# Patient Record
Sex: Female | Born: 1945
Health system: Southern US, Community
[De-identification: ages and names within clinical notes are randomized; demographics above are authoritative.]

## PROBLEM LIST (undated history)

## (undated) DIAGNOSIS — E7800 Pure hypercholesterolemia, unspecified: Secondary | ICD-10-CM

## (undated) DIAGNOSIS — R0600 Dyspnea, unspecified: Secondary | ICD-10-CM

## (undated) DIAGNOSIS — M1990 Unspecified osteoarthritis, unspecified site: Secondary | ICD-10-CM

## (undated) DIAGNOSIS — T8859XA Other complications of anesthesia, initial encounter: Secondary | ICD-10-CM

## (undated) DIAGNOSIS — J189 Pneumonia, unspecified organism: Secondary | ICD-10-CM

## (undated) DIAGNOSIS — Z87442 Personal history of urinary calculi: Secondary | ICD-10-CM

## (undated) DIAGNOSIS — E119 Type 2 diabetes mellitus without complications: Secondary | ICD-10-CM

## (undated) DIAGNOSIS — I2699 Other pulmonary embolism without acute cor pulmonale: Secondary | ICD-10-CM

## (undated) DIAGNOSIS — K2270 Barrett's esophagus without dysplasia: Secondary | ICD-10-CM

## (undated) DIAGNOSIS — I10 Essential (primary) hypertension: Secondary | ICD-10-CM

## (undated) DIAGNOSIS — Z9889 Other specified postprocedural states: Secondary | ICD-10-CM

## (undated) DIAGNOSIS — M810 Age-related osteoporosis without current pathological fracture: Secondary | ICD-10-CM

## (undated) DIAGNOSIS — R51 Headache: Secondary | ICD-10-CM

## (undated) DIAGNOSIS — G709 Myoneural disorder, unspecified: Secondary | ICD-10-CM

## (undated) DIAGNOSIS — M069 Rheumatoid arthritis, unspecified: Secondary | ICD-10-CM

## (undated) DIAGNOSIS — E039 Hypothyroidism, unspecified: Secondary | ICD-10-CM

## (undated) DIAGNOSIS — R112 Nausea with vomiting, unspecified: Secondary | ICD-10-CM

## (undated) DIAGNOSIS — K219 Gastro-esophageal reflux disease without esophagitis: Secondary | ICD-10-CM

## (undated) DIAGNOSIS — F329 Major depressive disorder, single episode, unspecified: Secondary | ICD-10-CM

## (undated) DIAGNOSIS — K5792 Diverticulitis of intestine, part unspecified, without perforation or abscess without bleeding: Secondary | ICD-10-CM

## (undated) DIAGNOSIS — F32A Depression, unspecified: Secondary | ICD-10-CM

## (undated) DIAGNOSIS — O2230 Deep phlebothrombosis in pregnancy, unspecified trimester: Secondary | ICD-10-CM

## (undated) DIAGNOSIS — T4145XA Adverse effect of unspecified anesthetic, initial encounter: Secondary | ICD-10-CM

## (undated) HISTORY — PX: EYE SURGERY: SHX253

## (undated) HISTORY — PX: REFRACTIVE SURGERY: SHX103

## (undated) HISTORY — DX: Diverticulitis of intestine, part unspecified, without perforation or abscess without bleeding: K57.92

## (undated) HISTORY — PX: BREAST SURGERY: SHX581

## (undated) HISTORY — PX: KNEE ARTHROSCOPY: SHX127

## (undated) HISTORY — PX: ESOPHAGOGASTRODUODENOSCOPY (EGD) WITH ESOPHAGEAL DILATION: SHX5812

## (undated) HISTORY — PX: APPENDECTOMY: SHX54

## (undated) HISTORY — PX: CHOLECYSTECTOMY OPEN: SUR202

## (undated) HISTORY — PX: INCONTINENCE SURGERY: SHX676

## (undated) HISTORY — PX: JOINT REPLACEMENT: SHX530

## (undated) HISTORY — PX: BACK SURGERY: SHX140

## (undated) HISTORY — PX: CYSTOSCOPY W/ STONE MANIPULATION: SHX1427

---

## 1966-10-16 HISTORY — PX: BREAST SURGERY: SHX581

## 1971-10-17 DIAGNOSIS — O2230 Deep phlebothrombosis in pregnancy, unspecified trimester: Secondary | ICD-10-CM

## 1971-10-17 HISTORY — DX: Deep phlebothrombosis in pregnancy, unspecified trimester: O22.30

## 1982-10-16 HISTORY — PX: DILATION AND CURETTAGE OF UTERUS: SHX78

## 1988-10-16 HISTORY — PX: ABDOMINAL HYSTERECTOMY: SHX81

## 1990-10-16 HISTORY — PX: ABDOMINAL ADHESION SURGERY: SHX90

## 1999-10-17 HISTORY — PX: CYST EXCISION: SHX5701

## 2005-10-16 HISTORY — PX: ENTEROCELE REPAIR: SHX623

## 2011-08-17 ENCOUNTER — Encounter (HOSPITAL_COMMUNITY): Payer: Self-pay | Admitting: Pharmacy Technician

## 2011-08-17 ENCOUNTER — Encounter (HOSPITAL_COMMUNITY)
Admission: RE | Admit: 2011-08-17 | Discharge: 2011-08-17 | Disposition: A | Discharge status: Home / Self Care | Payer: Medicare Other | Source: Ambulatory Visit | Attending: Neurosurgery | Admitting: Neurosurgery

## 2011-08-17 ENCOUNTER — Encounter (HOSPITAL_COMMUNITY): Payer: Self-pay

## 2011-08-17 DIAGNOSIS — D497 Neoplasm of unspecified behavior of endocrine glands and other parts of nervous system: Secondary | ICD-10-CM | POA: Insufficient documentation

## 2011-08-17 HISTORY — DX: Myoneural disorder, unspecified: G70.9

## 2011-08-17 HISTORY — DX: Unspecified osteoarthritis, unspecified site: M19.90

## 2011-08-17 HISTORY — DX: Headache: R51

## 2011-08-17 HISTORY — DX: Gastro-esophageal reflux disease without esophagitis: K21.9

## 2011-08-17 LAB — DIFFERENTIAL
Basophils Relative: 0 % (ref 0–1)
Eosinophils Absolute: 0.4 10*3/uL (ref 0.0–0.7)
Eosinophils Relative: 6 % — ABNORMAL HIGH (ref 0–5)
Monocytes Absolute: 0.5 10*3/uL (ref 0.1–1.0)
Monocytes Relative: 8 % (ref 3–12)
Neutrophils Relative %: 58 % (ref 43–77)

## 2011-08-17 LAB — SURGICAL PCR SCREEN
MRSA, PCR: NEGATIVE
Staphylococcus aureus: NEGATIVE

## 2011-08-17 LAB — BASIC METABOLIC PANEL
Calcium: 9.8 mg/dL (ref 8.4–10.5)
Creatinine, Ser: 0.8 mg/dL (ref 0.50–1.10)
GFR calc non Af Amer: 76 mL/min — ABNORMAL LOW (ref >90)
Sodium: 141 mEq/L (ref 135–145)

## 2011-08-17 LAB — CBC
MCH: 31.1 pg (ref 26.0–34.0)
MCHC: 34.5 g/dL (ref 30.0–36.0)
MCV: 90.4 fL (ref 78.0–100.0)
Platelets: 195 10*3/uL (ref 150–400)
RDW: 11.9 % (ref 11.5–15.5)

## 2011-08-17 NOTE — Pre-Procedure Instructions (Signed)
20 MIREYA MEDITZ  08/17/2011   Your procedure is scheduled on: NOV 19 Report to [facility] at 0900 AM.   Call this number if you have problems the morning of surgery: [phone]   Remember:   Do not eat food:After Midnight.  Do not drink clear liquids: 4 Hours before arrival.  Take these medicines the morning of surgery with A SIP OF WATER: NONE  NE  Do not wear jewelry, make-up or nail polish.  Do not wear lotions, powders, or perfumes. You may wear deodorant.  Do not shave 48 hours prior to surgery.  Do not bring valuables to the hospital.  Contacts, dentures or bridgework may not be worn into surgery.  Leave suitcase in the car. After surgery it may be brought to your room.  For patients admitted to the hospital, checkout time is 11:00 AM the day of discharge.   Patients discharged the day of surgery will not be allowed to drive home.  Name and phone number of your driver: FAMILY  Special Instructions: CHG Shower Use Special Wash: 1/2 bottle night before surgery and 1/2 bottle morning of surgery.   Please read over the following fact sheets that you were given: Pain Booklet, Coughing and Deep Breathing, Blood Transfusion Information, Lab Information and Surgical Site Infection Prevention

## 2011-08-31 NOTE — Pre-Procedure Instructions (Signed)
20 Sara Diaz  08/31/2011   Your procedure is scheduled on:  November 19  Report to [facility] at 8:45 AM.  Call this number if you have problems the morning of surgery: [phone]   Remember:   Do not eat food:After Midnight.  Do not drink clear liquids: 4 Hours before arrival.  Take these medicines the morning of surgery with A SIP OF WATER: Nadolol   Do not wear jewelry, make-up or nail polish.  Do not wear lotions, powders, or perfumes. You may wear deodorant.  Do not shave 48 hours prior to surgery.  Do not bring valuables to the hospital.  Contacts, dentures or bridgework may not be worn into surgery.  Leave suitcase in the car. After surgery it may be brought to your room.  For patients admitted to the hospital, checkout time is 11:00 AM the day of discharge.   Patients discharged the day of surgery will not be allowed to drive home.  Name and phone number of your driver: family  Special Instructions: CHG Shower Use Special Wash: 1/2 bottle night before surgery and 1/2 bottle morning of surgery.   Please read over the following fact sheets that you were given: Pain Booklet, Coughing and Deep Breathing, Blood Transfusion Information and Surgical Site Infection Prevention

## 2011-09-01 ENCOUNTER — Encounter (HOSPITAL_COMMUNITY)
Admission: RE | Admit: 2011-09-01 | Discharge: 2011-09-01 | Disposition: A | Discharge status: Home / Self Care | Payer: Medicare Other | Source: Ambulatory Visit | Attending: Neurosurgery | Admitting: Neurosurgery

## 2011-09-01 ENCOUNTER — Other Ambulatory Visit: Payer: Self-pay

## 2011-09-01 LAB — BASIC METABOLIC PANEL
BUN: 14 mg/dL (ref 6–23)
Calcium: 9.6 mg/dL (ref 8.4–10.5)
Creatinine, Ser: 0.83 mg/dL (ref 0.50–1.10)
GFR calc Af Amer: 84 mL/min — ABNORMAL LOW (ref >90)

## 2011-09-01 LAB — CBC
HCT: 41.4 % (ref 36.0–46.0)
MCHC: 34.3 g/dL (ref 30.0–36.0)
MCV: 90.4 fL (ref 78.0–100.0)
Platelets: 179 10*3/uL (ref 150–400)
RDW: 11.9 % (ref 11.5–15.5)
WBC: 5.2 10*3/uL (ref 4.0–10.5)

## 2011-09-01 LAB — DIFFERENTIAL
Basophils Absolute: 0 10*3/uL (ref 0.0–0.1)
Basophils Relative: 1 % (ref 0–1)
Eosinophils Relative: 5 % (ref 0–5)
Lymphocytes Relative: 32 % (ref 12–46)
Monocytes Absolute: 0.3 10*3/uL (ref 0.1–1.0)

## 2011-09-03 MED ORDER — DEXAMETHASONE SODIUM PHOSPHATE 10 MG/ML IJ SOLN
10.0000 mg | Freq: Once | INTRAMUSCULAR | Status: AC
  Administered 2011-09-04: 10 mg via INTRAVENOUS
  Filled 2011-09-03: qty 1

## 2011-09-04 ENCOUNTER — Encounter (HOSPITAL_COMMUNITY): Payer: Self-pay | Admitting: Anesthesiology

## 2011-09-04 ENCOUNTER — Encounter (HOSPITAL_COMMUNITY)
Admission: RE | Disposition: A | Discharge status: Home / Self Care | Payer: Self-pay | Source: Ambulatory Visit | Attending: Neurosurgery

## 2011-09-04 ENCOUNTER — Inpatient Hospital Stay (HOSPITAL_COMMUNITY)
Admission: RE | Admit: 2011-09-04 | Discharge: 2011-09-05 | DRG: 030 | Disposition: A | Discharge status: Home / Self Care | Payer: Medicare Other | Source: Ambulatory Visit | Attending: Neurosurgery | Admitting: Neurosurgery

## 2011-09-04 ENCOUNTER — Other Ambulatory Visit: Payer: Self-pay | Admitting: Neurosurgery

## 2011-09-04 ENCOUNTER — Inpatient Hospital Stay (HOSPITAL_COMMUNITY): Payer: Medicare Other | Admitting: Anesthesiology

## 2011-09-04 DIAGNOSIS — E119 Type 2 diabetes mellitus without complications: Secondary | ICD-10-CM | POA: Diagnosis present

## 2011-09-04 DIAGNOSIS — Z01810 Encounter for preprocedural cardiovascular examination: Secondary | ICD-10-CM

## 2011-09-04 DIAGNOSIS — D321 Benign neoplasm of spinal meninges: Principal | ICD-10-CM | POA: Diagnosis present

## 2011-09-04 DIAGNOSIS — D497 Neoplasm of unspecified behavior of endocrine glands and other parts of nervous system: Secondary | ICD-10-CM | POA: Diagnosis present

## 2011-09-04 DIAGNOSIS — R51 Headache: Secondary | ICD-10-CM | POA: Diagnosis present

## 2011-09-04 DIAGNOSIS — M129 Arthropathy, unspecified: Secondary | ICD-10-CM | POA: Diagnosis present

## 2011-09-04 DIAGNOSIS — K219 Gastro-esophageal reflux disease without esophagitis: Secondary | ICD-10-CM | POA: Diagnosis present

## 2011-09-04 DIAGNOSIS — Z01812 Encounter for preprocedural laboratory examination: Secondary | ICD-10-CM

## 2011-09-04 DIAGNOSIS — I739 Peripheral vascular disease, unspecified: Secondary | ICD-10-CM | POA: Diagnosis present

## 2011-09-04 DIAGNOSIS — M79609 Pain in unspecified limb: Secondary | ICD-10-CM | POA: Diagnosis present

## 2011-09-04 DIAGNOSIS — R29898 Other symptoms and signs involving the musculoskeletal system: Secondary | ICD-10-CM | POA: Diagnosis present

## 2011-09-04 HISTORY — PX: LAMINECTOMY: SHX219

## 2011-09-04 SURGERY — Surgical Case
Anesthesia: *Unknown

## 2011-09-04 SURGERY — LUMBAR LAMINECTOMY FOR TUMOR
Anesthesia: General | Site: Back | Wound class: Clean

## 2011-09-04 MED ORDER — NADOLOL 40 MG PO TABS
40.0000 mg | ORAL_TABLET | Freq: Every day | ORAL | Status: DC
  Filled 2011-09-04 (×3): qty 1

## 2011-09-04 MED ORDER — THROMBIN 20000 UNITS EX KIT
PACK | CUTANEOUS | Status: DC | PRN
  Administered 2011-09-04: 09:00:00 via TOPICAL

## 2011-09-04 MED ORDER — SODIUM CHLORIDE 0.9 % IJ SOLN
3.0000 mL | Freq: Two times a day (BID) | INTRAMUSCULAR | Status: DC
  Administered 2011-09-04 – 2011-09-05 (×2): 3 mL via INTRAVENOUS

## 2011-09-04 MED ORDER — KETOROLAC TROMETHAMINE 30 MG/ML IJ SOLN
30.0000 mg | Freq: Four times a day (QID) | INTRAMUSCULAR | Status: DC
  Administered 2011-09-04 – 2011-09-05 (×4): 30 mg via INTRAVENOUS
  Filled 2011-09-04 (×7): qty 1

## 2011-09-04 MED ORDER — THERA M PLUS PO TABS
1.0000 | ORAL_TABLET | Freq: Every day | ORAL | Status: DC
  Administered 2011-09-04: 1 via ORAL
  Filled 2011-09-04 (×2): qty 1

## 2011-09-04 MED ORDER — ACETAMINOPHEN 650 MG RE SUPP: 650.0000 mg | RECTAL | Status: DC | PRN

## 2011-09-04 MED ORDER — ONDANSETRON HCL 4 MG/2ML IJ SOLN: 4.0000 mg | Freq: Once | INTRAMUSCULAR | Status: DC | PRN

## 2011-09-04 MED ORDER — MENTHOL 3 MG MT LOZG: 1.0000 | LOZENGE | OROMUCOSAL | Status: DC | PRN

## 2011-09-04 MED ORDER — KETOROLAC TROMETHAMINE 30 MG/ML IJ SOLN
INTRAMUSCULAR | Status: DC | PRN
  Administered 2011-09-04: 30 mg via INTRAVENOUS

## 2011-09-04 MED ORDER — OXYCODONE-ACETAMINOPHEN 5-325 MG PO TABS
1.0000 | ORAL_TABLET | ORAL | Status: DC | PRN
  Administered 2011-09-04: 1 via ORAL
  Filled 2011-09-04: qty 1

## 2011-09-04 MED ORDER — HYDROCODONE-ACETAMINOPHEN 5-325 MG PO TABS
1.0000 | ORAL_TABLET | ORAL | Status: DC | PRN
  Administered 2011-09-04: 2 via ORAL
  Filled 2011-09-04: qty 2

## 2011-09-04 MED ORDER — BUPIVACAINE HCL (PF) 0.25 % IJ SOLN
INTRAMUSCULAR | Status: DC | PRN
  Administered 2011-09-04: 20 mL

## 2011-09-04 MED ORDER — HYDROMORPHONE HCL PF 1 MG/ML IJ SOLN: 0.2500 mg | INTRAMUSCULAR | Status: DC | PRN

## 2011-09-04 MED ORDER — LACTATED RINGERS IV SOLN
INTRAVENOUS | Status: DC | PRN
  Administered 2011-09-04 (×2): via INTRAVENOUS

## 2011-09-04 MED ORDER — PHENOL 1.4 % MT LIQD: 1.0000 | OROMUCOSAL | Status: DC | PRN

## 2011-09-04 MED ORDER — ALUM & MAG HYDROXIDE-SIMETH 400-400-40 MG/5ML PO SUSP
30.0000 mL | Freq: Four times a day (QID) | ORAL | Status: DC | PRN
  Administered 2011-09-04: 30 mL via ORAL
  Filled 2011-09-04: qty 30

## 2011-09-04 MED ORDER — LACTATED RINGERS IV SOLN: INTRAVENOUS | Status: DC

## 2011-09-04 MED ORDER — LEVOTHYROXINE SODIUM 125 MCG PO TABS
125.0000 ug | ORAL_TABLET | Freq: Every day | ORAL | Status: DC
  Administered 2011-09-04: 125 ug via ORAL
  Filled 2011-09-04 (×2): qty 1

## 2011-09-04 MED ORDER — CHOLECALCIFEROL 100 MCG (4000 UT) PO CAPS
4000.0000 [IU] | ORAL_CAPSULE | Freq: Every day | ORAL | Status: DC
  Administered 2011-09-04 – 2011-09-05 (×2): 4000 [IU] via ORAL
  Filled 2011-09-04 (×2): qty 1

## 2011-09-04 MED ORDER — ESTRADIOL 1 MG PO TABS
0.5000 mg | ORAL_TABLET | Freq: Every day | ORAL | Status: DC
  Administered 2011-09-04 – 2011-09-05 (×2): via ORAL
  Filled 2011-09-04 (×2): qty 0.5

## 2011-09-04 MED ORDER — NEOSTIGMINE METHYLSULFATE 1 MG/ML IJ SOLN
INTRAMUSCULAR | Status: DC | PRN
  Administered 2011-09-04: 5 mg via INTRAMUSCULAR

## 2011-09-04 MED ORDER — DROPERIDOL 2.5 MG/ML IJ SOLN
INTRAMUSCULAR | Status: DC | PRN
  Administered 2011-09-04: 0.625 mg via INTRAVENOUS

## 2011-09-04 MED ORDER — SENNA 8.6 MG PO TABS
1.0000 | ORAL_TABLET | Freq: Two times a day (BID) | ORAL | Status: DC
  Filled 2011-09-04 (×3): qty 1

## 2011-09-04 MED ORDER — ONDANSETRON HCL 4 MG/2ML IJ SOLN: 4.0000 mg | INTRAMUSCULAR | Status: DC | PRN

## 2011-09-04 MED ORDER — DOCUSATE SODIUM 100 MG PO CAPS
100.0000 mg | ORAL_CAPSULE | Freq: Two times a day (BID) | ORAL | Status: DC
  Administered 2011-09-05: 100 mg via ORAL
  Filled 2011-09-04 (×2): qty 1

## 2011-09-04 MED ORDER — SODIUM CHLORIDE 0.9 % IR SOLN
Status: DC | PRN
  Administered 2011-09-04: 09:00:00

## 2011-09-04 MED ORDER — MELOXICAM 15 MG PO TABS
15.0000 mg | ORAL_TABLET | Freq: Every day | ORAL | Status: DC
  Administered 2011-09-04 – 2011-09-05 (×2): 15 mg via ORAL
  Filled 2011-09-04 (×2): qty 1

## 2011-09-04 MED ORDER — SUFENTANIL CITRATE 50 MCG/ML IV SOLN
INTRAVENOUS | Status: DC | PRN
  Administered 2011-09-04: 5 ug via INTRAVENOUS
  Administered 2011-09-04: 25 ug via INTRAVENOUS

## 2011-09-04 MED ORDER — VANCOMYCIN HCL IN DEXTROSE 1-5 GM/200ML-% IV SOLN
1000.0000 mg | Freq: Once | INTRAVENOUS | Status: AC
  Administered 2011-09-04: 1000 mg via INTRAVENOUS
  Filled 2011-09-04: qty 200

## 2011-09-04 MED ORDER — SIMVASTATIN 10 MG PO TABS
10.0000 mg | ORAL_TABLET | Freq: Every day | ORAL | Status: DC
  Administered 2011-09-04: 10 mg via ORAL
  Filled 2011-09-04 (×2): qty 1

## 2011-09-04 MED ORDER — ROCURONIUM BROMIDE 100 MG/10ML IV SOLN
INTRAVENOUS | Status: DC | PRN
  Administered 2011-09-04: 55 mg via INTRAVENOUS

## 2011-09-04 MED ORDER — VANCOMYCIN HCL 500 MG IV SOLR
500.0000 mg | INTRAVENOUS | Status: AC
  Administered 2011-09-04: 500 mg via INTRAVENOUS
  Filled 2011-09-04: qty 500

## 2011-09-04 MED ORDER — SODIUM CHLORIDE 0.9 % IR SOLN
Status: DC | PRN
  Administered 2011-09-04: 1000 mL

## 2011-09-04 MED ORDER — PHENYLEPHRINE HCL 10 MG/ML IJ SOLN
10.0000 mg | INTRAVENOUS | Status: DC | PRN
  Administered 2011-09-04: 10 ug/min via INTRAVENOUS

## 2011-09-04 MED ORDER — ACETAMINOPHEN 325 MG PO TABS: 650.0000 mg | ORAL_TABLET | ORAL | Status: DC | PRN

## 2011-09-04 MED ORDER — AMITRIPTYLINE HCL 50 MG PO TABS
50.0000 mg | ORAL_TABLET | Freq: Every day | ORAL | Status: DC
  Administered 2011-09-04: 50 mg via ORAL
  Filled 2011-09-04 (×2): qty 1

## 2011-09-04 MED ORDER — GLYCOPYRROLATE 0.2 MG/ML IJ SOLN
INTRAMUSCULAR | Status: DC | PRN
  Administered 2011-09-04: .6 mg via INTRAVENOUS

## 2011-09-04 MED ORDER — DEXAMETHASONE SODIUM PHOSPHATE 4 MG/ML IJ SOLN
4.0000 mg | Freq: Four times a day (QID) | INTRAMUSCULAR | Status: DC
  Administered 2011-09-04 (×2): 4 mg via INTRAVENOUS
  Filled 2011-09-04 (×7): qty 1

## 2011-09-04 MED ORDER — HYDROMORPHONE HCL PF 1 MG/ML IJ SOLN
0.5000 mg | INTRAMUSCULAR | Status: DC | PRN
  Administered 2011-09-04: 0.5 mg via INTRAVENOUS
  Filled 2011-09-04: qty 1

## 2011-09-04 MED ORDER — PROPOFOL 10 MG/ML IV EMUL
INTRAVENOUS | Status: DC | PRN
  Administered 2011-09-04: 200 mg via INTRAVENOUS

## 2011-09-04 MED ORDER — ZOLPIDEM TARTRATE 5 MG PO TABS: 5.0000 mg | ORAL_TABLET | Freq: Every evening | ORAL | Status: DC | PRN

## 2011-09-04 MED ORDER — ONDANSETRON HCL 4 MG/2ML IJ SOLN
INTRAMUSCULAR | Status: DC | PRN
  Administered 2011-09-04: 4 mg via INTRAVENOUS

## 2011-09-04 MED ORDER — DEXAMETHASONE 4 MG PO TABS
4.0000 mg | ORAL_TABLET | Freq: Four times a day (QID) | ORAL | Status: DC
  Administered 2011-09-05 (×2): 4 mg via ORAL
  Filled 2011-09-04 (×3): qty 1
  Filled 2011-09-04: qty 21
  Filled 2011-09-04 (×2): qty 1

## 2011-09-04 SURGICAL SUPPLY — 68 items
BAG DECANTER FOR FLEXI CONT (MISCELLANEOUS) ×2 IMPLANT
BENZOIN TINCTURE PRP APPL 2/3 (GAUZE/BANDAGES/DRESSINGS) ×2 IMPLANT
BLADE SURG 11 STRL SS (BLADE) IMPLANT
BLADE SURG ROTATE 9660 (MISCELLANEOUS) IMPLANT
BRUSH SCRUB EZ PLAIN DRY (MISCELLANEOUS) ×2 IMPLANT
BUR CUTTER 7.0 ROUND (BURR) ×2 IMPLANT
BUR MATCHSTICK NEURO 3.0 LAGG (BURR) ×2 IMPLANT
CANISTER SUCTION 2500CC (MISCELLANEOUS) ×2 IMPLANT
CLOSURE STERI STRIP 1/2 X4 (GAUZE/BANDAGES/DRESSINGS) ×2 IMPLANT
CLOTH BEACON ORANGE TIMEOUT ST (SAFETY) ×2 IMPLANT
CONT SPEC 4OZ CLIKSEAL STRL BL (MISCELLANEOUS) ×4 IMPLANT
DERMABOND ADVANCED (GAUZE/BANDAGES/DRESSINGS) ×1
DERMABOND ADVANCED .7 DNX12 (GAUZE/BANDAGES/DRESSINGS) ×1 IMPLANT
DRAPE LAPAROTOMY 100X72X124 (DRAPES) ×2 IMPLANT
DRAPE LAPAROTOMY T 102X78X121 (DRAPES) IMPLANT
DRAPE MICROSCOPE LEICA (MISCELLANEOUS) ×2 IMPLANT
DRAPE POUCH INSTRU U-SHP 10X18 (DRAPES) ×2 IMPLANT
DRAPE SURG 17X23 STRL (DRAPES) ×4 IMPLANT
DURASEAL SPINE SEALANT ×2 IMPLANT
ELECT REM PT RETURN 9FT ADLT (ELECTROSURGICAL) ×2
ELECTRODE REM PT RTRN 9FT ADLT (ELECTROSURGICAL) ×1 IMPLANT
GAUZE SPONGE 4X4 16PLY XRAY LF (GAUZE/BANDAGES/DRESSINGS) ×2 IMPLANT
GLOVE BIO SURGEON STRL SZ8 (GLOVE) ×2 IMPLANT
GLOVE BIOGEL PI IND STRL 7.0 (GLOVE) ×1 IMPLANT
GLOVE BIOGEL PI INDICATOR 7.0 (GLOVE) ×1
GLOVE ECLIPSE 8.5 STRL (GLOVE) ×2 IMPLANT
GLOVE EXAM NITRILE LRG STRL (GLOVE) ×4 IMPLANT
GLOVE EXAM NITRILE MD LF STRL (GLOVE) ×2 IMPLANT
GLOVE EXAM NITRILE XL STR (GLOVE) IMPLANT
GLOVE EXAM NITRILE XS STR PU (GLOVE) IMPLANT
GLOVE SURG SS PI 6.5 STRL IVOR (GLOVE) ×4 IMPLANT
GOWN BRE IMP SLV AUR LG STRL (GOWN DISPOSABLE) IMPLANT
GOWN BRE IMP SLV AUR XL STRL (GOWN DISPOSABLE) ×6 IMPLANT
GOWN STRL REIN 2XL LVL4 (GOWN DISPOSABLE) IMPLANT
HEMOSTAT SURGICEL 2X14 (HEMOSTASIS) IMPLANT
KIT BASIN OR (CUSTOM PROCEDURE TRAY) ×2 IMPLANT
KIT ROOM TURNOVER OR (KITS) ×2 IMPLANT
NEEDLE HYPO 22GX1.5 SAFETY (NEEDLE) IMPLANT
NEEDLE HYPO 25X1 1.5 SAFETY (NEEDLE) ×2 IMPLANT
NEEDLE SPNL 20GX3.5 QUINCKE YW (NEEDLE) ×2 IMPLANT
NS IRRIG 1000ML POUR BTL (IV SOLUTION) ×2 IMPLANT
PACK LAMINECTOMY NEURO (CUSTOM PROCEDURE TRAY) ×2 IMPLANT
PAD EYE OVAL STERILE LF (GAUZE/BANDAGES/DRESSINGS) IMPLANT
PATTIES SURGICAL .5 X.5 (GAUZE/BANDAGES/DRESSINGS) IMPLANT
PATTIES SURGICAL .5 X3 (DISPOSABLE) IMPLANT
RUBBERBAND STERILE (MISCELLANEOUS) ×4 IMPLANT
SPONGE GAUZE 4X4 12PLY (GAUZE/BANDAGES/DRESSINGS) ×2 IMPLANT
SPONGE LAP 4X18 X RAY DECT (DISPOSABLE) IMPLANT
SPONGE SURGIFOAM ABS GEL 100 (HEMOSTASIS) ×2 IMPLANT
STAPLER VISISTAT 35W (STAPLE) ×2 IMPLANT
STRIP CLOSURE SKIN 1/2X4 (GAUZE/BANDAGES/DRESSINGS) ×2 IMPLANT
SUT BONE WAX W31G (SUTURE) IMPLANT
SUT ETHILON 4 0 PS 2 18 (SUTURE) IMPLANT
SUT NURALON 4 0 TR CR/8 (SUTURE) ×4 IMPLANT
SUT PROLENE 5 0 C1 (SUTURE) ×2 IMPLANT
SUT PROLENE 6 0 BV (SUTURE) IMPLANT
SUT VIC AB 0 CT1 18XCR BRD8 (SUTURE) ×2 IMPLANT
SUT VIC AB 0 CT1 8-18 (SUTURE) ×2
SUT VIC AB 2-0 CT1 18 (SUTURE) ×2 IMPLANT
SUT VIC AB 3-0 SH 8-18 (SUTURE) ×4 IMPLANT
SUT VICRYL 4-0 PS2 18IN ABS (SUTURE) ×2 IMPLANT
SYR 20ML ECCENTRIC (SYRINGE) IMPLANT
TAPE CLOTH SURG 4X10 WHT LF (GAUZE/BANDAGES/DRESSINGS) ×2 IMPLANT
TOWEL OR 17X24 6PK STRL BLUE (TOWEL DISPOSABLE) ×2 IMPLANT
TOWEL OR 17X26 10 PK STRL BLUE (TOWEL DISPOSABLE) ×2 IMPLANT
TRAY FOLEY CATH 14FRSI W/METER (CATHETERS) ×2 IMPLANT
TUBE CONNECTING 12X1/4 (SUCTIONS) IMPLANT
WATER STERILE IRR 1000ML POUR (IV SOLUTION) ×2 IMPLANT

## 2011-09-04 NOTE — Anesthesia Postprocedure Evaluation (Signed)
  Anesthesia Post-op Note  Patient: Sara Diaz  Procedure(s) Performed:  LUMBAR LAMINECTOMY FOR TUMOR - Lumbar five laminectomy for resection of intradural tumor  Patient Location: PACU  Anesthesia Type: General  Level of Consciousness: awake, alert , oriented and patient cooperative  Airway and Oxygen Therapy: Patient Spontanous Breathing and Patient connected to nasal cannula oxygen  Post-op Pain: mild  Post-op Assessment: Post-op Vital signs reviewed, Patient's Cardiovascular Status Stable, Respiratory Function Stable, Patent Airway and No signs of Nausea or vomiting  Post-op Vital Signs: stable  Complications: No apparent anesthesia complications

## 2011-09-04 NOTE — Progress Notes (Addendum)
Pharmacy note: vancomycin 12hrs post-op x1 -65 yo female s/p laminectomy  -SCr=0.83 (11/16)  -wt= 78kg  -vancomycin 500mg  IV given at 0725 this am -Will give 1000mg  IV vancomycin tonight at 7pm then no additional doses needed.  Harland German, Pharm D 09/04/2011 12:47 PM

## 2011-09-04 NOTE — Brief Op Note (Signed)
09/04/2011  9:55 AM  PATIENT:  Stephan Minister  65 y.o. 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   Delay start of Pharmacological VTE agent (>24hrs) due to surgical blood loss or risk of bleeding:  yes

## 2011-09-04 NOTE — Transfer of Care (Signed)
Immediate Anesthesia Transfer of Care Note  Patient: Sara Diaz  Procedure(s) Performed:  LUMBAR LAMINECTOMY FOR TUMOR - Lumbar five laminectomy for resection of intradural tumor  Patient Location: PACU  Anesthesia Type: General  Level of Consciousness: awake, alert , oriented and patient cooperative  Airway & Oxygen Therapy: Patient Spontanous Breathing and Patient connected to nasal cannula oxygen  Post-op Assessment: Report given to PACU RN, Post -op Vital signs reviewed and stable, Patient moving all extremities, Patient moving all extremities X 4 and Patient able to stick tongue midline  Post vital signs: Reviewed and stable  Complications: No apparent anesthesia complications

## 2011-09-04 NOTE — H&P (Signed)
Sara Diaz is an 65 y.o. female.   Chief Complaint: Right lower extremity pain HPI: Patient is a 65 year old female with history of right lumbosacral pain with radiation into her a lower trimming. Symptoms are aggravated by prolonged standing or sitting. They've not responded anti-inflammatory medications. Workup has demonstrated evidence of an enhancing intradural neoplasm at the L5 level causing marked compression and displacement of her cauda equina. Tissue presents now for resection of her intradural mass. She has no history of neoplasm otherwise. She has no history of infection. She does have remote history of a lumbar puncture for myelogram many years ago.  Past Medical History  Diagnosis Date  . Diabetes mellitus     prediabetic.lost 20 lbs.off  metformin x 1 yr  . GERD (gastroesophageal reflux disease)     off prilosec .developed cough .thought to be allergic  to prilosec  . Headache     h/a lasts 7 days.recurs monthly  . Neuromuscular disorder     weakness r leg.  . Arthritis     neck ,hands  . Peripheral vascular disease     h/o dvt w/pe 1975  . Renal calculi     bladder tack,cysto for kidney stones    Past Surgical History  Procedure Date  . Appendectomy   . Abdominal hysterectomy   . Breast surgery   . Cysto x 3   . Knee arthroscopy   . Hand surgery   . Cervical fusion     No family history on file. Social History:  reports that she has never smoked. She does not have any smokeless tobacco history on file. She reports that she does not drink alcohol. Her drug history not on file.  Allergies:  Allergies  Allergen Reactions  . Contrast Media (Iodinated Diagnostic Agents) Other (See Comments)    Convulsions  . Penicillins Rash    Medications Prior to Admission  Medication Dose Route Frequency Provider Last Rate Last Dose  . dexamethasone (DECADRON) injection 10 mg  10 mg Intravenous Once Sherilyn Cooter A    10 mg at 09/04/11 0720  . vancomycin (VANCOCIN) 500 mg  in sodium chloride 0.9 % 100 mL IVPB  500 mg Intravenous To NeurOR  A        No current outpatient prescriptions on file as of 09/04/2011.    No results found for this or any previous visit (from the past 48 hour(s)). No results found.  Review of Systems  All other systems reviewed and are negative.    Blood pressure 166/97, pulse 59, temperature 98.1 F (36.7 C), temperature source Oral, resp. rate 18, height 5\' 7"  (1.702 m), weight 78.019 kg (172 lb), SpO2 97.00%. Physical Exam  Nursing note and vitals reviewed. Constitutional: She is oriented to person, place, and time. She appears well-developed and well-nourished.  HENT:  Head: Normocephalic and atraumatic.  Right Ear: External ear normal.  Left Ear: External ear normal.  Nose: Nose normal.  Mouth/Throat: Oropharynx is clear and moist.  Eyes: Conjunctivae and EOM are normal. Pupils are equal, round, and reactive to light.  Neck: Normal range of motion. Neck supple.  Cardiovascular: Normal rate, regular rhythm, normal heart sounds and intact distal pulses.   Respiratory: Effort normal and breath sounds normal.  GI: Soft. Bowel sounds are normal.  Musculoskeletal: Normal range of motion.  Neurological: She is alert and oriented to person, place, and time. She has normal reflexes. No cranial nerve deficit. She exhibits normal muscle tone. Coordination normal.  Skin: Skin is  warm and dry.    Motor examination reveals soft slight weakness and wasting right-sided gastrotomies muscle otherwise motor she is intact or sensory symmetry of some decreased sensation over light touch in her right L5 and right S1 dermatome. Deep tendon versus a normal active except her right Achilles reflex is diminished report to her left. Assessment/Plan Enhancing intradural neoplasm with cauda equina compression. Plan is for resection of this intradural mass. Risks and benefits explained patient wishes to proceed.  , A 09/04/2011,  7:47 AM

## 2011-09-04 NOTE — Addendum Note (Signed)
Addendum  created 09/04/11 1107 by Coralee Rud   Modules edited:Charges VN

## 2011-09-04 NOTE — Plan of Care (Signed)
Problem: Consults Goal: Diagnosis - Spinal Surgery Outcome: Completed/Met Date Met:  09/04/11 Lumbar Laminectomy (Complex)

## 2011-09-04 NOTE — Anesthesia Preprocedure Evaluation (Addendum)
Anesthesia Evaluation  Patient identified by MRN, date of birth, ID band Patient awake    Reviewed: Allergy & Precautions, H&P , NPO status , Patient's Chart, lab work & pertinent test results  History of Anesthesia Complications (+) AWARENESS UNDER ANESTHESIA  Airway Mallampati: I TM Distance: >3 FB Neck ROM: full    Dental  (+) Teeth Intact   Pulmonary neg pulmonary ROS,    Pulmonary exam normal       Cardiovascular neg cardio ROS regular Normal    Neuro/Psych  Headaches,  Neuromuscular disease Negative Psych ROS   GI/Hepatic GERD-  ,  Endo/Other  Diabetes mellitus-  Renal/GU negative Renal ROS  Genitourinary negative   Musculoskeletal   Abdominal   Peds  Hematology negative hematology ROS (+)   Anesthesia Other Findings   Reproductive/Obstetrics                          Anesthesia Physical Anesthesia Plan  ASA: I  Anesthesia Plan: General   Post-op Pain Management:    Induction: Intravenous  Airway Management Planned: Oral ETT  Additional Equipment:   Intra-op Plan:   Post-operative Plan:   Informed Consent: I have reviewed the patients History and Physical, chart, labs and discussed the procedure including the risks, benefits and alternatives for the proposed anesthesia with the patient or authorized representative who has indicated his/her understanding and acceptance.     Plan Discussed with: Anesthesiologist, CRNA and Surgeon  Anesthesia Plan Comments:         Anesthesia Quick Evaluation

## 2011-09-04 NOTE — Op Note (Signed)
Date of procedure: 09/04/2011  Date of dictation: Same  Service: Neurosurgery  Preoperative diagnosis: L5 intradural neoplasm, extra medullary  Postoperative diagnosis: Same  Procedure Name: L5 laminectomy with resection of intradural tumor. Microdissection.  Surgeon: A., M.D.  Asst. Surgeon: Marikay Alar  Anesthesia: General  Indication: [name] low 65 year old woman with history of back pain with some relation to right lower extremity ongoing for many years but becoming somewhat worse. Workup has demonstrated evidence of an enhancing cystic mass within her spinal canal within the intradural space causing marked compression of her lower cauda equina. Imaging Characteristics or not pathognomonic for any particular type of tumor at this point although the possibility this may represent a epidermoid following a previous lumbar puncture versus a cystic schwannoma is the more likely scenario. Plan is for operative resection and pathologic analysis.  Operative note: Patient is taken to the operating room placed off of in a supine position. After adequate level of anesthesia was achieved, the patient positioned prone and appropriate padded. Patient's lumbar region was prepped and draped sterilely. 10 blade used to make a linear skin incision overlying the L4-5 and S1 levels. This carried down sharply in the midline. Subperiosteal dissection was then performed bilaterally exposing the lamina of L4-L5 and S1. Deep self retaining retractor was placed intraoperative x-ray was not felt to be needed. Laminectomy of L5 and inferior laminectomy of L4 and superior laminectomy of S1 were performed using Leksell rongeurs Kerrison years and a Proofreader. Minimal amounts of facetectomy were performed. Bony edges were waxed. Dura was then elevated and incised with a 15 blade. CSF was allowed to drain. The durotomy was then extended cranially and caudally. The dural edges were held in place using dural  traction sutures. Microscope was brought field these were microdissection. The tumor was readily apparent. This was densely adherent to the surrounding nerve roots particularly on the right side. These were carefully dissected free using a mixture of gentle blunt and sharp dissection. All nerve roots were dissected free from the cystic mass. The mass was resected and sent to pathologist for permanent analysis. There were no areas of intradural bleeding. Wound was then irrigated with and bike solution. Dura was reapproximated in a ring fashion using a 5-0 Prolene suture. DuraSeal fibrin sealant was placed over the dural repair. Gelfoam was placed over the laminectomy defect. Wound is inspected for hemostasis which Ascent be good. Wound is then closed in layers with Vicryl sutures. Steri-Strips and a sterile dressing were applied. There were no apparent complications. Patient tolerated the procedure well and returned to the recovery room postop.

## 2011-09-05 ENCOUNTER — Encounter (HOSPITAL_COMMUNITY): Payer: Self-pay | Admitting: Neurosurgery

## 2011-09-05 MED ORDER — HYDROCODONE-ACETAMINOPHEN 5-325 MG PO TABS: 1.0000 | ORAL_TABLET | ORAL | Status: AC | PRN

## 2011-09-05 MED FILL — Sodium Chloride Irrigation Soln 0.9%: Qty: 500 | Status: AC

## 2011-09-05 NOTE — Discharge Summary (Signed)
Physician Discharge Summary  Patient ID: TECORA EUSTACHE MRN: [id number] DOB/AGE: 1946/04/18 65 y.o.  Admit date: 09/04/2011 Discharge date: 09/05/2011  Admission Diagnoses:  Discharge Diagnoses:  Active Problems:  * No active hospital problems. *    Discharged Condition: good  Hospital Course: Patient is taken to the operating room where uncomplicated laminectomy and resection of intradural tumor is performed. Pathology is pending. Gross total tumor resection was achieved. Patient is awakened neurologically intact with minimal pain. Wound is healing well. There is no evidence of CSF leak. She is voiding without difficulty. Ready for discharge home.  Consults: none  Significant Diagnostic Studies: None  Treatments: surgery: Lumbar laminectomy with resection of intradural extramedullary tumor.  Discharge Exam: Blood pressure 135/73, pulse 76, temperature 97.7 F (36.5 C), temperature source Oral, resp. rate 16, height 5\' 7"  (1.702 m), weight 78.019 kg (172 lb), SpO2 93.00%. She is awake and alert oriented and appropriate. Cranial nerve function is intact. Motor and sensory function extremities is normal. Wound is healing well. Chest and abdomen are benign.  Disposition: Final discharge disposition not confirmed   Current Discharge Medication List    START taking these medications   Details  HYDROcodone-acetaminophen (NORCO) 5-325 MG per tablet Take 1-2 tablets by mouth every 4 (four) hours as needed for pain. Qty: 60 tablet, Refills: 1      CONTINUE these medications which have NOT CHANGED   Details  amitriptyline (ELAVIL) 50 MG tablet Take 50 mg by mouth at bedtime.      Cholecalciferol (HM VITAMIN D3) 4000 UNITS CAPS Take 4,000 Units by mouth daily.      estradiol (ESTRACE) 0.5 MG tablet Take 0.5 mg by mouth daily.      meloxicam (MOBIC) 15 MG tablet Take 15 mg by mouth daily.      nadolol (CORGARD) 40 MG tablet Take 40 mg by mouth daily.      therapeutic  multivitamin-minerals (THERAGRAN-M) tablet Take 1 tablet by mouth daily.      levothyroxine (SYNTHROID, LEVOTHROID) 125 MCG tablet Take 125 mcg by mouth daily.      pravastatin (PRAVACHOL) 20 MG tablet Take 20 mg by mouth daily.         Follow-up Information    Call , A.   Contact information:   [address] [place] [address] [phone]          Signed: Temple Pacini 09/05/2011, 10:22 AM

## 2014-02-19 ENCOUNTER — Other Ambulatory Visit: Payer: Self-pay | Admitting: Radiology

## 2014-08-08 ENCOUNTER — Emergency Department (HOSPITAL_BASED_OUTPATIENT_CLINIC_OR_DEPARTMENT_OTHER)
Admission: EM | Admit: 2014-08-08 | Discharge: 2014-08-08 | Disposition: A | Discharge status: Home / Self Care | Payer: Medicare Other | Attending: Emergency Medicine | Admitting: Emergency Medicine

## 2014-08-08 ENCOUNTER — Encounter (HOSPITAL_BASED_OUTPATIENT_CLINIC_OR_DEPARTMENT_OTHER): Payer: Self-pay | Admitting: Emergency Medicine

## 2014-08-08 DIAGNOSIS — Z87442 Personal history of urinary calculi: Secondary | ICD-10-CM | POA: Diagnosis not present

## 2014-08-08 DIAGNOSIS — Z79899 Other long term (current) drug therapy: Secondary | ICD-10-CM | POA: Insufficient documentation

## 2014-08-08 DIAGNOSIS — K219 Gastro-esophageal reflux disease without esophagitis: Secondary | ICD-10-CM | POA: Insufficient documentation

## 2014-08-08 DIAGNOSIS — L089 Local infection of the skin and subcutaneous tissue, unspecified: Secondary | ICD-10-CM | POA: Diagnosis present

## 2014-08-08 DIAGNOSIS — Z791 Long term (current) use of non-steroidal anti-inflammatories (NSAID): Secondary | ICD-10-CM | POA: Insufficient documentation

## 2014-08-08 DIAGNOSIS — Z792 Long term (current) use of antibiotics: Secondary | ICD-10-CM | POA: Diagnosis not present

## 2014-08-08 DIAGNOSIS — E119 Type 2 diabetes mellitus without complications: Secondary | ICD-10-CM | POA: Insufficient documentation

## 2014-08-08 DIAGNOSIS — Z8739 Personal history of other diseases of the musculoskeletal system and connective tissue: Secondary | ICD-10-CM | POA: Diagnosis not present

## 2014-08-08 DIAGNOSIS — Z880 Allergy status to penicillin: Secondary | ICD-10-CM | POA: Diagnosis not present

## 2014-08-08 DIAGNOSIS — T798XXA Other early complications of trauma, initial encounter: Secondary | ICD-10-CM

## 2014-08-08 MED ORDER — CEPHALEXIN 250 MG PO CAPS
500.0000 mg | ORAL_CAPSULE | Freq: Once | ORAL | Status: AC
  Administered 2014-08-08: 500 mg via ORAL
  Filled 2014-08-08: qty 2

## 2014-08-08 MED ORDER — CEPHALEXIN 500 MG PO CAPS: 500.0000 mg | ORAL_CAPSULE | Freq: Three times a day (TID) | ORAL | Status: AC

## 2014-08-08 NOTE — Discharge Instructions (Signed)
Wound Infection °A wound infection happens when a type of germ (bacteria) starts growing in the wound. In some cases, this can cause the wound to break open. If cared for properly, the infected wound will heal from the inside to the outside. Wound infections need treatment. °CAUSES °An infection is caused by bacteria growing in the wound.  °SYMPTOMS  °· Increase in redness, swelling, or pain at the wound site. °· Increase in drainage at the wound site. °· Wound or bandage (dressing) starts to smell bad. °· Fever. °· Feeling tired or fatigued. °· Pus draining from the wound. °TREATMENT  °Your health care provider will prescribe antibiotic medicine. The wound infection should improve within 24 to 48 hours. Any redness around the wound should stop spreading and the wound should be less painful.  °HOME CARE INSTRUCTIONS  °· Only take over-the-counter or prescription medicines for pain, discomfort, or fever as directed by your health care provider. °· Take your antibiotics as directed. Finish them even if you start to feel better. °· Gently wash the area with mild soap and water 2 times a day, or as directed. Rinse off the soap. Pat the area dry with a clean towel. Do not rub the wound. This may cause bleeding. °· Follow your health care provider's instructions for how often you need to change the dressing. °· Apply ointment and a dressing to the wound as directed. °· If the dressing sticks, moisten it with soapy water and gently remove it. °· Change the bandage right away if it becomes wet, dirty, or develops a bad smell. °· Take showers. Do not take tub baths, swim, or do anything that may soak the wound until it is healed. °· Avoid exercises that make you sweat heavily. °· Use anti-itch medicine as directed by your health care provider. The wound may itch when it is healing. Do not pick or scratch at the wound. °· Follow up with your health care provider to get your wound rechecked as directed. °SEEK MEDICAL CARE  IF: °· You have an increase in swelling, pain, or redness around the wound. °· You have an increase in the amount of pus coming from the wound. °· There is a bad smell coming from the wound. °· More of the wound breaks open. °· You have a fever. °MAKE SURE YOU:  °· Understand these instructions. °· Will watch your condition. °· Will get help right away if you are not doing well or get worse. °Document Released: 07/01/2003 Document Revised: 10/07/2013 Document Reviewed: 02/05/2011 °[employer]® Patient Information ©2015 [employer]. This information is not intended to replace advice given to you by your health care provider. Make sure you discuss any questions you have with your health care provider. ° °

## 2014-08-08 NOTE — ED Provider Notes (Signed)
CSN: [id number]     Arrival date & time 08/08/14  5681 History   None    Chief Complaint  Patient presents with  . Hand Injury      HPI  Presents with area of her left hand she is concerned may be infected.Marland Kitchen Has history of recurrent skin infections in this area. Have an area of ecchymosis few weeks ago. He began to drain 4-5 days ago. She feels like it is "pus" when areas of skin are either bumped or peeled back.  No streaks up the arm. No fever chills or signs of systemic infection.  Past Medical History  Diagnosis Date  . Diabetes mellitus     prediabetic.lost 20 lbs.off  metformin x 1 yr  . GERD (gastroesophageal reflux disease)     off prilosec .developed cough .thought to be allergic  to prilosec  . Headache(784.0)     h/a lasts 7 days.recurs monthly  . Neuromuscular disorder     weakness r leg.  . Arthritis     neck ,hands  . Peripheral vascular disease     h/o dvt w/pe 1975  . Renal calculi     bladder tack,cysto for kidney stones   Past Surgical History  Procedure Laterality Date  . Appendectomy    . Abdominal hysterectomy    . Breast surgery    . Cysto x 3    . Knee arthroscopy    . Hand surgery    . Cervical fusion    . Laminectomy  09/04/2011    Procedure: LUMBAR LAMINECTOMY FOR TUMOR;  Surgeon: Cooper Render Pool;  Location: [facility];  Service: Neurosurgery;  Laterality: N/A;  Lumbar five laminectomy for resection of intradural tumor  . Back surgery     No family history on file. History  Substance Use Topics  . Smoking status: Never Smoker   . Smokeless tobacco: Not on file  . Alcohol Use: No   OB History   Grav Para Term Preterm Abortions TAB SAB Ect Mult Living                 Review of Systems  Constitutional: Negative for fever, chills, diaphoresis, appetite change and fatigue.  HENT: Negative for mouth sores, sore throat and trouble swallowing.   Eyes: Negative for visual disturbance.  Respiratory: Negative for cough, chest tightness,  shortness of breath and wheezing.   Cardiovascular: Negative for chest pain.  Gastrointestinal: Negative for nausea, vomiting, abdominal pain, diarrhea and abdominal distention.  Endocrine: Negative for polydipsia, polyphagia and polyuria.  Genitourinary: Negative for dysuria, frequency and hematuria.  Musculoskeletal: Negative for gait problem.  Skin: Negative for color change, pallor and rash.       Red area with drainage near the left hand she is concerned as infected.  Neurological: Negative for dizziness, syncope, light-headedness and headaches.  Hematological: Does not bruise/bleed easily.  Psychiatric/Behavioral: Negative for behavioral problems and confusion.      Allergies  Contrast media and Penicillins  Home Medications   Prior to Admission medications   Medication Sig Start Date End Date Taking? Authorizing Provider  Canagliflozin (INVOKANA) 100 MG TABS Take by mouth.   Yes Historical Provider, MD  cycloSPORINE (RESTASIS) 0.05 % ophthalmic emulsion 1 drop 2 (two) times daily.   Yes Historical Provider, MD  folic acid (FOLVITE) 1 MG tablet Take 1 mg by mouth daily.   Yes Historical Provider, MD  methotrexate (RHEUMATREX) 2.5 MG tablet Take 7.5 mg by mouth 3 (three) times a  week.   Yes Historical Provider, MD  amitriptyline (ELAVIL) 50 MG tablet Take 50 mg by mouth at bedtime.      Historical Provider, MD  cephALEXin (KEFLEX) 500 MG capsule Take 1 capsule (500 mg total) by mouth 3 (three) times daily. 08/08/14 08/15/14  Tanna Furry, MD  Cholecalciferol (HM VITAMIN D3) 4000 UNITS CAPS Take 4,000 Units by mouth daily.      Historical Provider, MD  estradiol (ESTRACE) 0.5 MG tablet Take 0.5 mg by mouth daily.      Historical Provider, MD  levothyroxine (SYNTHROID, LEVOTHROID) 125 MCG tablet Take 125 mcg by mouth daily.      Historical Provider, MD  meloxicam (MOBIC) 15 MG tablet Take 15 mg by mouth daily.      Historical Provider, MD  nadolol (CORGARD) 40 MG tablet Take 40 mg by  mouth daily.      Historical Provider, MD  pravastatin (PRAVACHOL) 20 MG tablet Take 20 mg by mouth daily.      Historical Provider, MD  therapeutic multivitamin-minerals (THERAGRAN-M) tablet Take 1 tablet by mouth daily.      Historical Provider, MD   BP 147/73  Pulse 77  Temp(Src) 98.2 F (36.8 C) (Oral)  Resp 20  Ht 5\' 7"  (1.702 m)  Wt 160 lb (72.576 kg)  BMI 25.05 kg/m2  SpO2 99% Physical Exam  Constitutional: She is oriented to person, place, and time. She appears well-developed and well-nourished. No distress.  HENT:  Head: Normocephalic.  Eyes: Conjunctivae are normal. Pupils are equal, round, and reactive to light. No scleral icterus.  Neck: Normal range of motion. Neck supple. No thyromegaly present.  Cardiovascular: Normal rate and regular rhythm.  Exam reveals no gallop and no friction rub.   No murmur heard. Pulmonary/Chest: Effort normal and breath sounds normal. No respiratory distress. She has no wheezes. She has no rales.  Abdominal: Soft. Bowel sounds are normal. She exhibits no distension. There is no tenderness. There is no rebound.  Musculoskeletal: Normal range of motion.  Neurological: She is alert and oriented to person, place, and time.  Skin: Skin is warm and dry. No rash noted.  Area adjacent the proximal thumb or area of radial styloid of the left hand. Ecchymotic/erythematous base. Some denuded skin. Not firm indurated. No proximal spread. Erythema surrounding.   Psychiatric: She has a normal mood and affect. Her behavior is normal.    ED Course  Procedures (including critical care time) Labs Review Labs Reviewed - No data to display  Imaging Review No results found.   EKG Interpretation None      MDM   Final diagnoses:  Wound infection, initial encounter    Does not appear overtly infected. However she is immune suppressed methotrexate, and recent prednisone use ending yesterday. Panel B. continue topical care with Bactroban, and by mouth  Keflex.    Tanna Furry, MD 08/08/14 [phone]

## 2015-02-21 ENCOUNTER — Encounter (HOSPITAL_BASED_OUTPATIENT_CLINIC_OR_DEPARTMENT_OTHER): Payer: Self-pay | Admitting: *Deleted

## 2015-02-21 ENCOUNTER — Emergency Department (HOSPITAL_BASED_OUTPATIENT_CLINIC_OR_DEPARTMENT_OTHER)
Admission: EM | Admit: 2015-02-21 | Discharge: 2015-02-21 | Disposition: A | Discharge status: Home / Self Care | Payer: PPO | Attending: Emergency Medicine | Admitting: Emergency Medicine

## 2015-02-21 ENCOUNTER — Emergency Department (HOSPITAL_BASED_OUTPATIENT_CLINIC_OR_DEPARTMENT_OTHER): Payer: PPO

## 2015-02-21 DIAGNOSIS — Z792 Long term (current) use of antibiotics: Secondary | ICD-10-CM | POA: Diagnosis not present

## 2015-02-21 DIAGNOSIS — W1839XA Other fall on same level, initial encounter: Secondary | ICD-10-CM | POA: Diagnosis not present

## 2015-02-21 DIAGNOSIS — S199XXA Unspecified injury of neck, initial encounter: Secondary | ICD-10-CM | POA: Diagnosis not present

## 2015-02-21 DIAGNOSIS — W19XXXA Unspecified fall, initial encounter: Secondary | ICD-10-CM

## 2015-02-21 DIAGNOSIS — Z8679 Personal history of other diseases of the circulatory system: Secondary | ICD-10-CM | POA: Insufficient documentation

## 2015-02-21 DIAGNOSIS — Z87442 Personal history of urinary calculi: Secondary | ICD-10-CM | POA: Diagnosis not present

## 2015-02-21 DIAGNOSIS — M1990 Unspecified osteoarthritis, unspecified site: Secondary | ICD-10-CM | POA: Diagnosis not present

## 2015-02-21 DIAGNOSIS — S8002XA Contusion of left knee, initial encounter: Secondary | ICD-10-CM | POA: Diagnosis not present

## 2015-02-21 DIAGNOSIS — Y998 Other external cause status: Secondary | ICD-10-CM | POA: Insufficient documentation

## 2015-02-21 DIAGNOSIS — M069 Rheumatoid arthritis, unspecified: Secondary | ICD-10-CM | POA: Diagnosis not present

## 2015-02-21 DIAGNOSIS — Z793 Long term (current) use of hormonal contraceptives: Secondary | ICD-10-CM | POA: Insufficient documentation

## 2015-02-21 DIAGNOSIS — Z880 Allergy status to penicillin: Secondary | ICD-10-CM | POA: Insufficient documentation

## 2015-02-21 DIAGNOSIS — Y9389 Activity, other specified: Secondary | ICD-10-CM | POA: Insufficient documentation

## 2015-02-21 DIAGNOSIS — Z8669 Personal history of other diseases of the nervous system and sense organs: Secondary | ICD-10-CM | POA: Insufficient documentation

## 2015-02-21 DIAGNOSIS — S6992XA Unspecified injury of left wrist, hand and finger(s), initial encounter: Secondary | ICD-10-CM | POA: Diagnosis present

## 2015-02-21 DIAGNOSIS — E119 Type 2 diabetes mellitus without complications: Secondary | ICD-10-CM | POA: Insufficient documentation

## 2015-02-21 DIAGNOSIS — Y9289 Other specified places as the place of occurrence of the external cause: Secondary | ICD-10-CM | POA: Diagnosis not present

## 2015-02-21 DIAGNOSIS — Z8719 Personal history of other diseases of the digestive system: Secondary | ICD-10-CM | POA: Diagnosis not present

## 2015-02-21 DIAGNOSIS — S60212A Contusion of left wrist, initial encounter: Secondary | ICD-10-CM | POA: Diagnosis not present

## 2015-02-21 DIAGNOSIS — Z79899 Other long term (current) drug therapy: Secondary | ICD-10-CM | POA: Diagnosis not present

## 2015-02-21 HISTORY — DX: Rheumatoid arthritis, unspecified: M06.9

## 2015-02-21 NOTE — ED Provider Notes (Signed)
CSN: [id number]     Arrival date & time 02/21/15  1312 History   First MD Initiated Contact with Patient 02/21/15 1327     Chief Complaint  Patient presents with  . Fall     (Consider location/radiation/quality/duration/timing/severity/associated sxs/prior Treatment) Patient is a 69 y.o. female presenting with fall. The history is provided by the patient.  Fall This is a new problem. Pertinent negatives include no shortness of breath.   patient tripped and fell while walking in her hallway. Has pain in her left knee and left wrist. She did not lose consciousness. Didn't hit her head. Slight neck pain. She's been ambulatory since it. Slight bruising to her left knee and her left wrist. No numbness or weakness. She is not on anticoagulation.  Past Medical History  Diagnosis Date  . Diabetes mellitus     prediabetic.lost 20 lbs.off  metformin x 1 yr  . GERD (gastroesophageal reflux disease)     off prilosec .developed cough .thought to be allergic  to prilosec  . Headache(784.0)     h/a lasts 7 days.recurs monthly  . Neuromuscular disorder     weakness r leg.  . Arthritis     neck ,hands  . Peripheral vascular disease     h/o dvt w/pe 1975  . Renal calculi     bladder tack,cysto for kidney stones  . RA (rheumatoid arthritis)    Past Surgical History  Procedure Laterality Date  . Appendectomy    . Abdominal hysterectomy    . Breast surgery    . Cysto x 3    . Knee arthroscopy    . Hand surgery    . Cervical fusion    . Laminectomy  09/04/2011    Procedure: LUMBAR LAMINECTOMY FOR TUMOR;  Surgeon: Cooper Render Pool;  Location: [place] [facility];  Service: Neurosurgery;  Laterality: N/A;  Lumbar five laminectomy for resection of intradural tumor  . Back surgery     No family history on file. History  Substance Use Topics  . Smoking status: Never Smoker   . Smokeless tobacco: Not on file  . Alcohol Use: No   OB History    No data available     Review of Systems  Constitutional:  Negative for fever.  Respiratory: Negative for shortness of breath.   Musculoskeletal: Positive for neck pain.       Left wrist and left knee pain.  Skin: Positive for wound.  Neurological: Negative for weakness and numbness.      Allergies  Contrast media and Penicillins  Home Medications   Prior to Admission medications   Medication Sig Start Date End Date Taking? Authorizing Provider  amitriptyline (ELAVIL) 50 MG tablet Take 50 mg by mouth at bedtime.     Yes Historical Provider, MD  Canagliflozin (INVOKANA) 100 MG TABS Take by mouth.   Yes Historical Provider, MD  Cholecalciferol (HM VITAMIN D3) 4000 UNITS CAPS Take 4,000 Units by mouth daily.     Yes Historical Provider, MD  cycloSPORINE (RESTASIS) 0.05 % ophthalmic emulsion 1 drop 2 (two) times daily.   Yes Historical Provider, MD  estradiol (ESTRACE) 0.5 MG tablet Take 0.5 mg by mouth daily.     Yes Historical Provider, MD  folic acid (FOLVITE) 1 MG tablet Take 1 mg by mouth daily.   Yes Historical Provider, MD  levothyroxine (SYNTHROID, LEVOTHROID) 125 MCG tablet Take 125 mcg by mouth daily.     Yes Historical Provider, MD  linagliptin (TRADJENTA) 5 MG TABS  tablet Take 5 mg by mouth daily.   Yes Historical Provider, MD  meloxicam (MOBIC) 15 MG tablet Take 15 mg by mouth daily.      Historical Provider, MD  methotrexate (RHEUMATREX) 2.5 MG tablet Take 7.5 mg by mouth 3 (three) times a week.   Yes Historical Provider, MD  methylPREDNISolone (MEDROL DOSEPAK) 4 MG TBPK tablet Take by mouth as needed.    Historical Provider, MD  nadolol (CORGARD) 40 MG tablet Take 40 mg by mouth daily.     Yes Historical Provider, MD  pravastatin (PRAVACHOL) 20 MG tablet Take 20 mg by mouth daily.     Yes Historical Provider, MD  therapeutic multivitamin-minerals (THERAGRAN-M) tablet Take 1 tablet by mouth daily.     Yes Historical Provider, MD   BP 118/50 mmHg  Pulse 73  Temp(Src) 98.3 F (36.8 C) (Oral)  Resp 18  Ht 5\' 7"  (1.702 m)  Wt 161  lb 8 oz (73.256 kg)  BMI 25.29 kg/m2  SpO2 96% Physical Exam  Constitutional: She appears well-developed.  Cardiovascular: Normal rate.   Pulmonary/Chest: Effort normal.  Abdominal: There is no tenderness.  Musculoskeletal: She exhibits tenderness.  Mild tenderness over ulnar styloid on left wrist. No snuffbox tenderness. Range of motion intact. Slight ecchymosis to anterior left knee over the patella. No deformity. Range of motion intact. No tenderness over elbow hip or ankle.  Neurological: She is alert.    ED Course  Procedures (including critical care time) Labs Review Labs Reviewed - No data to display  Imaging Review Dg Wrist Complete Left  02/21/2015   CLINICAL DATA:  Fall. LEFT wrist pain. Medial wrist pain and swelling.  EXAM: LEFT WRIST - COMPLETE 3+ VIEW  COMPARISON:  None.  FINDINGS: Osteopenia. No displaced fracture. Scaphoid bone appears intact. Distal radius and ulna appear normal. Mild STT joint and basal joint of the thumb osteoarthritis.  IMPRESSION: No acute osseous abnormality.   Electronically Signed   By: Dereck Ligas M.D.   On: 02/21/2015 13:44     EKG Interpretation None      MDM   Final diagnoses:  Fall, initial encounter  Wrist contusion, left, initial encounter  Knee contusion, left, initial encounter    Patient with fall. Slight ecchymosis to left knee and some ecchymosis and tenderness to left wrist dorsally. Negative x-ray. Will discharge home.    Davonna Belling, MD 02/21/15 [phone]

## 2015-02-21 NOTE — Discharge Instructions (Signed)
Contusion °A contusion is a deep bruise. Contusions are the result of an injury that caused bleeding under the skin. The contusion may turn blue, purple, or yellow. Minor injuries will give you a painless contusion, but more severe contusions may stay painful and swollen for a few weeks.  °CAUSES  °A contusion is usually caused by a blow, trauma, or direct force to an area of the body. °SYMPTOMS  °· Swelling and redness of the injured area. °· Bruising of the injured area. °· Tenderness and soreness of the injured area. °· Pain. °DIAGNOSIS  °The diagnosis can be made by taking a history and physical exam. An X-ray, CT scan, or MRI may be needed to determine if there were any associated injuries, such as fractures. °TREATMENT  °Specific treatment will depend on what area of the body was injured. In general, the best treatment for a contusion is resting, icing, elevating, and applying cold compresses to the injured area. Over-the-counter medicines may also be recommended for pain control. Ask your caregiver what the best treatment is for your contusion. °HOME CARE INSTRUCTIONS  °· Put ice on the injured area. °¨ Put ice in a plastic bag. °¨ Place a towel between your skin and the bag. °¨ Leave the ice on for 15-20 minutes, 3-4 times a day, or as directed by your health care provider. °· Only take over-the-counter or prescription medicines for pain, discomfort, or fever as directed by your caregiver. Your caregiver may recommend avoiding anti-inflammatory medicines (aspirin, ibuprofen, and naproxen) for 48 hours because these medicines may increase bruising. °· Rest the injured area. °· If possible, elevate the injured area to reduce swelling. °SEEK IMMEDIATE MEDICAL CARE IF:  °· You have increased bruising or swelling. °· You have pain that is getting worse. °· Your swelling or pain is not relieved with medicines. °MAKE SURE YOU:  °· Understand these instructions. °· Will watch your condition. °· Will get help right  away if you are not doing well or get worse. °Document Released: 07/12/2005 Document Revised: 10/07/2013 Document Reviewed: 08/07/2011 °Exit[employer]® Patient Information ©2015 [employer]. This information is not intended to replace advice given to you by your health care provider. Make sure you discuss any questions you have with your health care provider. ° °

## 2015-02-21 NOTE — ED Notes (Signed)
Reports she fell in her hallway- c/o left wrist and left knee pain- denies LOC

## 2015-10-17 HISTORY — PX: TENDON REPAIR: SHX5111

## 2015-10-17 HISTORY — PX: JOINT REPLACEMENT: SHX530

## 2015-10-20 DIAGNOSIS — J029 Acute pharyngitis, unspecified: Secondary | ICD-10-CM | POA: Diagnosis not present

## 2015-10-20 DIAGNOSIS — Z6841 Body Mass Index (BMI) 40.0 and over, adult: Secondary | ICD-10-CM | POA: Diagnosis not present

## 2015-10-27 DIAGNOSIS — Z79899 Other long term (current) drug therapy: Secondary | ICD-10-CM | POA: Diagnosis not present

## 2015-10-27 DIAGNOSIS — M0579 Rheumatoid arthritis with rheumatoid factor of multiple sites without organ or systems involvement: Secondary | ICD-10-CM | POA: Diagnosis not present

## 2015-11-11 DIAGNOSIS — Z01818 Encounter for other preprocedural examination: Secondary | ICD-10-CM | POA: Diagnosis not present

## 2015-11-11 DIAGNOSIS — I493 Ventricular premature depolarization: Secondary | ICD-10-CM | POA: Diagnosis not present

## 2015-11-11 DIAGNOSIS — E1121 Type 2 diabetes mellitus with diabetic nephropathy: Secondary | ICD-10-CM | POA: Diagnosis not present

## 2015-11-22 DIAGNOSIS — Z6826 Body mass index (BMI) 26.0-26.9, adult: Secondary | ICD-10-CM | POA: Diagnosis not present

## 2015-11-22 DIAGNOSIS — Z01818 Encounter for other preprocedural examination: Secondary | ICD-10-CM | POA: Diagnosis not present

## 2015-11-22 DIAGNOSIS — M1812 Unilateral primary osteoarthritis of first carpometacarpal joint, left hand: Secondary | ICD-10-CM | POA: Diagnosis not present

## 2015-11-30 DIAGNOSIS — M19042 Primary osteoarthritis, left hand: Secondary | ICD-10-CM | POA: Diagnosis not present

## 2015-11-30 DIAGNOSIS — M189 Osteoarthritis of first carpometacarpal joint, unspecified: Secondary | ICD-10-CM | POA: Diagnosis not present

## 2015-11-30 DIAGNOSIS — M1812 Unilateral primary osteoarthritis of first carpometacarpal joint, left hand: Secondary | ICD-10-CM | POA: Diagnosis not present

## 2015-12-06 DIAGNOSIS — E039 Hypothyroidism, unspecified: Secondary | ICD-10-CM | POA: Diagnosis not present

## 2015-12-06 DIAGNOSIS — E1169 Type 2 diabetes mellitus with other specified complication: Secondary | ICD-10-CM | POA: Diagnosis not present

## 2015-12-06 DIAGNOSIS — E559 Vitamin D deficiency, unspecified: Secondary | ICD-10-CM | POA: Diagnosis not present

## 2015-12-06 DIAGNOSIS — E1165 Type 2 diabetes mellitus with hyperglycemia: Secondary | ICD-10-CM | POA: Diagnosis not present

## 2015-12-06 DIAGNOSIS — M810 Age-related osteoporosis without current pathological fracture: Secondary | ICD-10-CM | POA: Diagnosis not present

## 2015-12-06 DIAGNOSIS — E785 Hyperlipidemia, unspecified: Secondary | ICD-10-CM | POA: Diagnosis not present

## 2015-12-06 DIAGNOSIS — Z6826 Body mass index (BMI) 26.0-26.9, adult: Secondary | ICD-10-CM | POA: Diagnosis not present

## 2015-12-06 DIAGNOSIS — I10 Essential (primary) hypertension: Secondary | ICD-10-CM | POA: Diagnosis not present

## 2015-12-13 DIAGNOSIS — M25532 Pain in left wrist: Secondary | ICD-10-CM | POA: Diagnosis not present

## 2015-12-17 DIAGNOSIS — M1812 Unilateral primary osteoarthritis of first carpometacarpal joint, left hand: Secondary | ICD-10-CM | POA: Diagnosis not present

## 2015-12-17 DIAGNOSIS — S66812A Strain of other specified muscles, fascia and tendons at wrist and hand level, left hand, initial encounter: Secondary | ICD-10-CM | POA: Diagnosis not present

## 2015-12-17 DIAGNOSIS — Z6826 Body mass index (BMI) 26.0-26.9, adult: Secondary | ICD-10-CM | POA: Diagnosis not present

## 2015-12-17 DIAGNOSIS — M25532 Pain in left wrist: Secondary | ICD-10-CM | POA: Diagnosis not present

## 2015-12-21 DIAGNOSIS — M25532 Pain in left wrist: Secondary | ICD-10-CM | POA: Diagnosis not present

## 2015-12-21 DIAGNOSIS — G8918 Other acute postprocedural pain: Secondary | ICD-10-CM | POA: Diagnosis not present

## 2015-12-21 DIAGNOSIS — M66332 Spontaneous rupture of flexor tendons, left forearm: Secondary | ICD-10-CM | POA: Diagnosis not present

## 2015-12-21 DIAGNOSIS — S66812A Strain of other specified muscles, fascia and tendons at wrist and hand level, left hand, initial encounter: Secondary | ICD-10-CM | POA: Diagnosis not present

## 2015-12-29 DIAGNOSIS — S66912A Strain of unspecified muscle, fascia and tendon at wrist and hand level, left hand, initial encounter: Secondary | ICD-10-CM | POA: Diagnosis not present

## 2016-01-11 DIAGNOSIS — M1712 Unilateral primary osteoarthritis, left knee: Secondary | ICD-10-CM | POA: Diagnosis not present

## 2016-01-19 DIAGNOSIS — M1812 Unilateral primary osteoarthritis of first carpometacarpal joint, left hand: Secondary | ICD-10-CM | POA: Diagnosis not present

## 2016-01-19 DIAGNOSIS — S66912D Strain of unspecified muscle, fascia and tendon at wrist and hand level, left hand, subsequent encounter: Secondary | ICD-10-CM | POA: Diagnosis not present

## 2016-02-01 DIAGNOSIS — H9209 Otalgia, unspecified ear: Secondary | ICD-10-CM | POA: Diagnosis not present

## 2016-02-01 DIAGNOSIS — G8929 Other chronic pain: Secondary | ICD-10-CM | POA: Diagnosis not present

## 2016-02-01 DIAGNOSIS — Z6826 Body mass index (BMI) 26.0-26.9, adult: Secondary | ICD-10-CM | POA: Diagnosis not present

## 2016-02-01 DIAGNOSIS — M25512 Pain in left shoulder: Secondary | ICD-10-CM | POA: Diagnosis not present

## 2016-02-01 DIAGNOSIS — Z6825 Body mass index (BMI) 25.0-25.9, adult: Secondary | ICD-10-CM | POA: Diagnosis not present

## 2016-02-09 DIAGNOSIS — Z6825 Body mass index (BMI) 25.0-25.9, adult: Secondary | ICD-10-CM | POA: Diagnosis not present

## 2016-02-09 DIAGNOSIS — E1165 Type 2 diabetes mellitus with hyperglycemia: Secondary | ICD-10-CM | POA: Diagnosis not present

## 2016-02-09 DIAGNOSIS — B370 Candidal stomatitis: Secondary | ICD-10-CM | POA: Diagnosis not present

## 2016-02-14 DIAGNOSIS — Z794 Long term (current) use of insulin: Secondary | ICD-10-CM | POA: Diagnosis not present

## 2016-02-14 DIAGNOSIS — E1165 Type 2 diabetes mellitus with hyperglycemia: Secondary | ICD-10-CM | POA: Diagnosis not present

## 2016-03-06 DIAGNOSIS — E1165 Type 2 diabetes mellitus with hyperglycemia: Secondary | ICD-10-CM | POA: Diagnosis not present

## 2016-03-08 DIAGNOSIS — N182 Chronic kidney disease, stage 2 (mild): Secondary | ICD-10-CM | POA: Diagnosis not present

## 2016-03-08 DIAGNOSIS — E1169 Type 2 diabetes mellitus with other specified complication: Secondary | ICD-10-CM | POA: Diagnosis not present

## 2016-03-08 DIAGNOSIS — E785 Hyperlipidemia, unspecified: Secondary | ICD-10-CM | POA: Diagnosis not present

## 2016-03-08 DIAGNOSIS — E559 Vitamin D deficiency, unspecified: Secondary | ICD-10-CM | POA: Diagnosis not present

## 2016-03-08 DIAGNOSIS — M810 Age-related osteoporosis without current pathological fracture: Secondary | ICD-10-CM | POA: Diagnosis not present

## 2016-03-08 DIAGNOSIS — M0579 Rheumatoid arthritis with rheumatoid factor of multiple sites without organ or systems involvement: Secondary | ICD-10-CM | POA: Diagnosis not present

## 2016-03-08 DIAGNOSIS — I10 Essential (primary) hypertension: Secondary | ICD-10-CM | POA: Diagnosis not present

## 2016-03-08 DIAGNOSIS — E1165 Type 2 diabetes mellitus with hyperglycemia: Secondary | ICD-10-CM | POA: Diagnosis not present

## 2016-03-08 DIAGNOSIS — E039 Hypothyroidism, unspecified: Secondary | ICD-10-CM | POA: Diagnosis not present

## 2016-03-08 DIAGNOSIS — Z6826 Body mass index (BMI) 26.0-26.9, adult: Secondary | ICD-10-CM | POA: Diagnosis not present

## 2016-03-15 ENCOUNTER — Ambulatory Visit: Payer: BC Managed Care – PPO | Admitting: Endocrinology

## 2016-03-20 DIAGNOSIS — E1165 Type 2 diabetes mellitus with hyperglycemia: Secondary | ICD-10-CM | POA: Diagnosis not present

## 2016-03-20 DIAGNOSIS — Z794 Long term (current) use of insulin: Secondary | ICD-10-CM | POA: Diagnosis not present

## 2016-03-21 DIAGNOSIS — M722 Plantar fascial fibromatosis: Secondary | ICD-10-CM | POA: Diagnosis not present

## 2016-03-22 DIAGNOSIS — Z79899 Other long term (current) drug therapy: Secondary | ICD-10-CM | POA: Diagnosis not present

## 2016-03-22 DIAGNOSIS — Z803 Family history of malignant neoplasm of breast: Secondary | ICD-10-CM | POA: Diagnosis not present

## 2016-03-22 DIAGNOSIS — Z1231 Encounter for screening mammogram for malignant neoplasm of breast: Secondary | ICD-10-CM | POA: Diagnosis not present

## 2016-03-22 DIAGNOSIS — M0579 Rheumatoid arthritis with rheumatoid factor of multiple sites without organ or systems involvement: Secondary | ICD-10-CM | POA: Diagnosis not present

## 2016-03-28 DIAGNOSIS — M722 Plantar fascial fibromatosis: Secondary | ICD-10-CM | POA: Diagnosis not present

## 2016-03-30 DIAGNOSIS — M79672 Pain in left foot: Secondary | ICD-10-CM | POA: Diagnosis not present

## 2016-04-14 DIAGNOSIS — M62172 Other rupture of muscle (nontraumatic), left ankle and foot: Secondary | ICD-10-CM | POA: Diagnosis not present

## 2016-05-02 DIAGNOSIS — Z6826 Body mass index (BMI) 26.0-26.9, adult: Secondary | ICD-10-CM | POA: Diagnosis not present

## 2016-05-02 DIAGNOSIS — R071 Chest pain on breathing: Secondary | ICD-10-CM | POA: Diagnosis not present

## 2016-05-02 DIAGNOSIS — M5134 Other intervertebral disc degeneration, thoracic region: Secondary | ICD-10-CM | POA: Diagnosis not present

## 2016-05-02 DIAGNOSIS — R0781 Pleurodynia: Secondary | ICD-10-CM | POA: Diagnosis not present

## 2016-05-02 DIAGNOSIS — J209 Acute bronchitis, unspecified: Secondary | ICD-10-CM | POA: Diagnosis not present

## 2016-05-16 DIAGNOSIS — M62172 Other rupture of muscle (nontraumatic), left ankle and foot: Secondary | ICD-10-CM | POA: Diagnosis not present

## 2016-05-29 DIAGNOSIS — E782 Mixed hyperlipidemia: Secondary | ICD-10-CM | POA: Diagnosis not present

## 2016-05-29 DIAGNOSIS — E1165 Type 2 diabetes mellitus with hyperglycemia: Secondary | ICD-10-CM | POA: Diagnosis not present

## 2016-05-29 DIAGNOSIS — E559 Vitamin D deficiency, unspecified: Secondary | ICD-10-CM | POA: Diagnosis not present

## 2016-06-12 DIAGNOSIS — E1165 Type 2 diabetes mellitus with hyperglycemia: Secondary | ICD-10-CM | POA: Diagnosis not present

## 2016-06-12 DIAGNOSIS — E7800 Pure hypercholesterolemia, unspecified: Secondary | ICD-10-CM | POA: Diagnosis not present

## 2016-06-12 DIAGNOSIS — E038 Other specified hypothyroidism: Secondary | ICD-10-CM | POA: Diagnosis not present

## 2016-06-12 DIAGNOSIS — I10 Essential (primary) hypertension: Secondary | ICD-10-CM | POA: Diagnosis not present

## 2016-06-12 DIAGNOSIS — Z794 Long term (current) use of insulin: Secondary | ICD-10-CM | POA: Diagnosis not present

## 2016-06-12 DIAGNOSIS — M810 Age-related osteoporosis without current pathological fracture: Secondary | ICD-10-CM | POA: Diagnosis not present

## 2016-06-12 DIAGNOSIS — E559 Vitamin D deficiency, unspecified: Secondary | ICD-10-CM | POA: Diagnosis not present

## 2016-06-21 DIAGNOSIS — M0579 Rheumatoid arthritis with rheumatoid factor of multiple sites without organ or systems involvement: Secondary | ICD-10-CM | POA: Diagnosis not present

## 2016-06-21 DIAGNOSIS — Z79899 Other long term (current) drug therapy: Secondary | ICD-10-CM | POA: Diagnosis not present

## 2016-06-28 DIAGNOSIS — M7062 Trochanteric bursitis, left hip: Secondary | ICD-10-CM | POA: Diagnosis not present

## 2016-07-03 DIAGNOSIS — L738 Other specified follicular disorders: Secondary | ICD-10-CM | POA: Diagnosis not present

## 2016-07-03 DIAGNOSIS — L300 Nummular dermatitis: Secondary | ICD-10-CM | POA: Diagnosis not present

## 2016-07-03 DIAGNOSIS — B355 Tinea imbricata: Secondary | ICD-10-CM | POA: Diagnosis not present

## 2016-07-10 DIAGNOSIS — M549 Dorsalgia, unspecified: Secondary | ICD-10-CM | POA: Diagnosis not present

## 2016-07-10 DIAGNOSIS — Z23 Encounter for immunization: Secondary | ICD-10-CM | POA: Diagnosis not present

## 2016-07-10 DIAGNOSIS — Z6826 Body mass index (BMI) 26.0-26.9, adult: Secondary | ICD-10-CM | POA: Diagnosis not present

## 2016-07-11 DIAGNOSIS — E1165 Type 2 diabetes mellitus with hyperglycemia: Secondary | ICD-10-CM | POA: Diagnosis not present

## 2016-07-13 DIAGNOSIS — E038 Other specified hypothyroidism: Secondary | ICD-10-CM | POA: Diagnosis not present

## 2016-07-13 DIAGNOSIS — I10 Essential (primary) hypertension: Secondary | ICD-10-CM | POA: Diagnosis not present

## 2016-07-13 DIAGNOSIS — E1165 Type 2 diabetes mellitus with hyperglycemia: Secondary | ICD-10-CM | POA: Diagnosis not present

## 2016-07-13 DIAGNOSIS — Z794 Long term (current) use of insulin: Secondary | ICD-10-CM | POA: Diagnosis not present

## 2016-07-13 DIAGNOSIS — M810 Age-related osteoporosis without current pathological fracture: Secondary | ICD-10-CM | POA: Diagnosis not present

## 2016-07-13 DIAGNOSIS — E559 Vitamin D deficiency, unspecified: Secondary | ICD-10-CM | POA: Diagnosis not present

## 2016-07-13 DIAGNOSIS — E7800 Pure hypercholesterolemia, unspecified: Secondary | ICD-10-CM | POA: Diagnosis not present

## 2016-08-04 DIAGNOSIS — M25531 Pain in right wrist: Secondary | ICD-10-CM | POA: Diagnosis not present

## 2016-08-04 DIAGNOSIS — Z6826 Body mass index (BMI) 26.0-26.9, adult: Secondary | ICD-10-CM | POA: Diagnosis not present

## 2016-08-10 DIAGNOSIS — E1165 Type 2 diabetes mellitus with hyperglycemia: Secondary | ICD-10-CM | POA: Diagnosis not present

## 2016-08-14 DIAGNOSIS — M810 Age-related osteoporosis without current pathological fracture: Secondary | ICD-10-CM | POA: Diagnosis not present

## 2016-08-14 DIAGNOSIS — I10 Essential (primary) hypertension: Secondary | ICD-10-CM | POA: Diagnosis not present

## 2016-08-14 DIAGNOSIS — E038 Other specified hypothyroidism: Secondary | ICD-10-CM | POA: Diagnosis not present

## 2016-08-14 DIAGNOSIS — E559 Vitamin D deficiency, unspecified: Secondary | ICD-10-CM | POA: Diagnosis not present

## 2016-08-14 DIAGNOSIS — E7800 Pure hypercholesterolemia, unspecified: Secondary | ICD-10-CM | POA: Diagnosis not present

## 2016-08-14 DIAGNOSIS — Z794 Long term (current) use of insulin: Secondary | ICD-10-CM | POA: Diagnosis not present

## 2016-08-14 DIAGNOSIS — E1165 Type 2 diabetes mellitus with hyperglycemia: Secondary | ICD-10-CM | POA: Diagnosis not present

## 2016-08-23 DIAGNOSIS — Z6826 Body mass index (BMI) 26.0-26.9, adult: Secondary | ICD-10-CM | POA: Diagnosis not present

## 2016-08-23 DIAGNOSIS — M1712 Unilateral primary osteoarthritis, left knee: Secondary | ICD-10-CM | POA: Diagnosis not present

## 2016-08-28 DIAGNOSIS — Z6826 Body mass index (BMI) 26.0-26.9, adult: Secondary | ICD-10-CM | POA: Diagnosis not present

## 2016-08-28 DIAGNOSIS — H52203 Unspecified astigmatism, bilateral: Secondary | ICD-10-CM | POA: Diagnosis not present

## 2016-08-28 DIAGNOSIS — H5203 Hypermetropia, bilateral: Secondary | ICD-10-CM | POA: Diagnosis not present

## 2016-08-28 DIAGNOSIS — H04213 Epiphora due to excess lacrimation, bilateral lacrimal glands: Secondary | ICD-10-CM | POA: Diagnosis not present

## 2016-08-28 DIAGNOSIS — H2513 Age-related nuclear cataract, bilateral: Secondary | ICD-10-CM | POA: Diagnosis not present

## 2016-08-28 DIAGNOSIS — H524 Presbyopia: Secondary | ICD-10-CM | POA: Diagnosis not present

## 2016-08-28 DIAGNOSIS — I10 Essential (primary) hypertension: Secondary | ICD-10-CM | POA: Diagnosis not present

## 2016-08-28 DIAGNOSIS — E119 Type 2 diabetes mellitus without complications: Secondary | ICD-10-CM | POA: Diagnosis not present

## 2016-09-12 DIAGNOSIS — E7800 Pure hypercholesterolemia, unspecified: Secondary | ICD-10-CM | POA: Diagnosis not present

## 2016-09-12 DIAGNOSIS — E1165 Type 2 diabetes mellitus with hyperglycemia: Secondary | ICD-10-CM | POA: Diagnosis not present

## 2016-09-19 DIAGNOSIS — I10 Essential (primary) hypertension: Secondary | ICD-10-CM | POA: Diagnosis not present

## 2016-09-19 DIAGNOSIS — E7800 Pure hypercholesterolemia, unspecified: Secondary | ICD-10-CM | POA: Diagnosis not present

## 2016-09-19 DIAGNOSIS — E559 Vitamin D deficiency, unspecified: Secondary | ICD-10-CM | POA: Diagnosis not present

## 2016-09-19 DIAGNOSIS — M810 Age-related osteoporosis without current pathological fracture: Secondary | ICD-10-CM | POA: Diagnosis not present

## 2016-09-19 DIAGNOSIS — E038 Other specified hypothyroidism: Secondary | ICD-10-CM | POA: Diagnosis not present

## 2016-09-19 DIAGNOSIS — E1165 Type 2 diabetes mellitus with hyperglycemia: Secondary | ICD-10-CM | POA: Diagnosis not present

## 2016-09-19 DIAGNOSIS — Z794 Long term (current) use of insulin: Secondary | ICD-10-CM | POA: Diagnosis not present

## 2016-10-17 DIAGNOSIS — N952 Postmenopausal atrophic vaginitis: Secondary | ICD-10-CM | POA: Diagnosis not present

## 2016-10-17 DIAGNOSIS — Z01419 Encounter for gynecological examination (general) (routine) without abnormal findings: Secondary | ICD-10-CM | POA: Diagnosis not present

## 2016-10-17 DIAGNOSIS — N941 Unspecified dyspareunia: Secondary | ICD-10-CM | POA: Diagnosis not present

## 2016-10-17 DIAGNOSIS — Z6826 Body mass index (BMI) 26.0-26.9, adult: Secondary | ICD-10-CM | POA: Diagnosis not present

## 2016-10-17 DIAGNOSIS — N951 Menopausal and female climacteric states: Secondary | ICD-10-CM | POA: Diagnosis not present

## 2016-10-19 DIAGNOSIS — K219 Gastro-esophageal reflux disease without esophagitis: Secondary | ICD-10-CM | POA: Diagnosis not present

## 2016-10-19 DIAGNOSIS — K2270 Barrett's esophagus without dysplasia: Secondary | ICD-10-CM | POA: Diagnosis not present

## 2016-10-23 DIAGNOSIS — D225 Melanocytic nevi of trunk: Secondary | ICD-10-CM | POA: Diagnosis not present

## 2016-10-23 DIAGNOSIS — L570 Actinic keratosis: Secondary | ICD-10-CM | POA: Diagnosis not present

## 2016-10-26 DIAGNOSIS — Z6826 Body mass index (BMI) 26.0-26.9, adult: Secondary | ICD-10-CM | POA: Diagnosis not present

## 2016-10-26 DIAGNOSIS — M1712 Unilateral primary osteoarthritis, left knee: Secondary | ICD-10-CM | POA: Diagnosis not present

## 2016-11-14 DIAGNOSIS — J209 Acute bronchitis, unspecified: Secondary | ICD-10-CM | POA: Diagnosis not present

## 2016-11-14 DIAGNOSIS — J0110 Acute frontal sinusitis, unspecified: Secondary | ICD-10-CM | POA: Diagnosis not present

## 2016-11-17 DIAGNOSIS — E7800 Pure hypercholesterolemia, unspecified: Secondary | ICD-10-CM | POA: Diagnosis not present

## 2016-11-17 DIAGNOSIS — E1165 Type 2 diabetes mellitus with hyperglycemia: Secondary | ICD-10-CM | POA: Diagnosis not present

## 2016-11-20 DIAGNOSIS — M810 Age-related osteoporosis without current pathological fracture: Secondary | ICD-10-CM | POA: Diagnosis not present

## 2016-11-20 DIAGNOSIS — Z794 Long term (current) use of insulin: Secondary | ICD-10-CM | POA: Diagnosis not present

## 2016-11-20 DIAGNOSIS — E1165 Type 2 diabetes mellitus with hyperglycemia: Secondary | ICD-10-CM | POA: Diagnosis not present

## 2016-11-20 DIAGNOSIS — E039 Hypothyroidism, unspecified: Secondary | ICD-10-CM | POA: Diagnosis not present

## 2016-11-20 DIAGNOSIS — E559 Vitamin D deficiency, unspecified: Secondary | ICD-10-CM | POA: Diagnosis not present

## 2016-11-20 DIAGNOSIS — I10 Essential (primary) hypertension: Secondary | ICD-10-CM | POA: Diagnosis not present

## 2016-11-20 DIAGNOSIS — E7800 Pure hypercholesterolemia, unspecified: Secondary | ICD-10-CM | POA: Diagnosis not present

## 2016-12-04 DIAGNOSIS — S20211A Contusion of right front wall of thorax, initial encounter: Secondary | ICD-10-CM | POA: Diagnosis not present

## 2016-12-13 DIAGNOSIS — M0579 Rheumatoid arthritis with rheumatoid factor of multiple sites without organ or systems involvement: Secondary | ICD-10-CM | POA: Diagnosis not present

## 2016-12-13 DIAGNOSIS — Z79899 Other long term (current) drug therapy: Secondary | ICD-10-CM | POA: Diagnosis not present

## 2016-12-14 DIAGNOSIS — M810 Age-related osteoporosis without current pathological fracture: Secondary | ICD-10-CM | POA: Diagnosis not present

## 2016-12-14 DIAGNOSIS — I10 Essential (primary) hypertension: Secondary | ICD-10-CM | POA: Diagnosis not present

## 2016-12-14 DIAGNOSIS — S2241XG Multiple fractures of ribs, right side, subsequent encounter for fracture with delayed healing: Secondary | ICD-10-CM | POA: Diagnosis not present

## 2016-12-14 DIAGNOSIS — Z13820 Encounter for screening for osteoporosis: Secondary | ICD-10-CM | POA: Diagnosis not present

## 2016-12-14 DIAGNOSIS — Z6826 Body mass index (BMI) 26.0-26.9, adult: Secondary | ICD-10-CM | POA: Diagnosis not present

## 2016-12-14 DIAGNOSIS — M549 Dorsalgia, unspecified: Secondary | ICD-10-CM | POA: Diagnosis not present

## 2016-12-15 DIAGNOSIS — M19012 Primary osteoarthritis, left shoulder: Secondary | ICD-10-CM | POA: Diagnosis not present

## 2016-12-15 DIAGNOSIS — M47814 Spondylosis without myelopathy or radiculopathy, thoracic region: Secondary | ICD-10-CM | POA: Diagnosis not present

## 2016-12-15 DIAGNOSIS — S2241XA Multiple fractures of ribs, right side, initial encounter for closed fracture: Secondary | ICD-10-CM | POA: Diagnosis not present

## 2016-12-15 DIAGNOSIS — M419 Scoliosis, unspecified: Secondary | ICD-10-CM | POA: Diagnosis not present

## 2016-12-15 DIAGNOSIS — M549 Dorsalgia, unspecified: Secondary | ICD-10-CM | POA: Diagnosis not present

## 2016-12-15 DIAGNOSIS — M5134 Other intervertebral disc degeneration, thoracic region: Secondary | ICD-10-CM | POA: Diagnosis not present

## 2016-12-15 DIAGNOSIS — S2241XG Multiple fractures of ribs, right side, subsequent encounter for fracture with delayed healing: Secondary | ICD-10-CM | POA: Diagnosis not present

## 2017-01-16 DIAGNOSIS — E1165 Type 2 diabetes mellitus with hyperglycemia: Secondary | ICD-10-CM | POA: Diagnosis not present

## 2017-01-16 DIAGNOSIS — Z794 Long term (current) use of insulin: Secondary | ICD-10-CM | POA: Diagnosis not present

## 2017-01-16 DIAGNOSIS — L84 Corns and callosities: Secondary | ICD-10-CM | POA: Diagnosis not present

## 2017-02-14 DIAGNOSIS — E039 Hypothyroidism, unspecified: Secondary | ICD-10-CM | POA: Diagnosis not present

## 2017-02-14 DIAGNOSIS — E7800 Pure hypercholesterolemia, unspecified: Secondary | ICD-10-CM | POA: Diagnosis not present

## 2017-02-14 DIAGNOSIS — E1165 Type 2 diabetes mellitus with hyperglycemia: Secondary | ICD-10-CM | POA: Diagnosis not present

## 2017-02-14 DIAGNOSIS — Z794 Long term (current) use of insulin: Secondary | ICD-10-CM | POA: Diagnosis not present

## 2017-02-19 DIAGNOSIS — E559 Vitamin D deficiency, unspecified: Secondary | ICD-10-CM | POA: Diagnosis not present

## 2017-02-19 DIAGNOSIS — I10 Essential (primary) hypertension: Secondary | ICD-10-CM | POA: Diagnosis not present

## 2017-02-19 DIAGNOSIS — E7800 Pure hypercholesterolemia, unspecified: Secondary | ICD-10-CM | POA: Diagnosis not present

## 2017-02-19 DIAGNOSIS — Z794 Long term (current) use of insulin: Secondary | ICD-10-CM | POA: Diagnosis not present

## 2017-02-19 DIAGNOSIS — E1165 Type 2 diabetes mellitus with hyperglycemia: Secondary | ICD-10-CM | POA: Diagnosis not present

## 2017-02-19 DIAGNOSIS — E039 Hypothyroidism, unspecified: Secondary | ICD-10-CM | POA: Diagnosis not present

## 2017-02-19 DIAGNOSIS — M810 Age-related osteoporosis without current pathological fracture: Secondary | ICD-10-CM | POA: Diagnosis not present

## 2017-02-21 DIAGNOSIS — M5416 Radiculopathy, lumbar region: Secondary | ICD-10-CM | POA: Diagnosis not present

## 2017-02-21 DIAGNOSIS — M5412 Radiculopathy, cervical region: Secondary | ICD-10-CM | POA: Diagnosis not present

## 2017-03-08 DIAGNOSIS — M5126 Other intervertebral disc displacement, lumbar region: Secondary | ICD-10-CM | POA: Diagnosis not present

## 2017-03-08 DIAGNOSIS — Z6826 Body mass index (BMI) 26.0-26.9, adult: Secondary | ICD-10-CM | POA: Diagnosis not present

## 2017-03-08 DIAGNOSIS — M5416 Radiculopathy, lumbar region: Secondary | ICD-10-CM | POA: Diagnosis not present

## 2017-03-08 DIAGNOSIS — M4802 Spinal stenosis, cervical region: Secondary | ICD-10-CM | POA: Diagnosis not present

## 2017-03-08 DIAGNOSIS — I10 Essential (primary) hypertension: Secondary | ICD-10-CM | POA: Diagnosis not present

## 2017-03-08 DIAGNOSIS — M5412 Radiculopathy, cervical region: Secondary | ICD-10-CM | POA: Diagnosis not present

## 2017-03-08 DIAGNOSIS — M542 Cervicalgia: Secondary | ICD-10-CM | POA: Diagnosis not present

## 2017-03-14 DIAGNOSIS — D485 Neoplasm of uncertain behavior of skin: Secondary | ICD-10-CM | POA: Diagnosis not present

## 2017-03-14 DIAGNOSIS — L300 Nummular dermatitis: Secondary | ICD-10-CM | POA: Diagnosis not present

## 2017-03-14 DIAGNOSIS — L820 Inflamed seborrheic keratosis: Secondary | ICD-10-CM | POA: Diagnosis not present

## 2017-03-21 DIAGNOSIS — Z79899 Other long term (current) drug therapy: Secondary | ICD-10-CM | POA: Diagnosis not present

## 2017-03-21 DIAGNOSIS — H04213 Epiphora due to excess lacrimation, bilateral lacrimal glands: Secondary | ICD-10-CM | POA: Diagnosis not present

## 2017-03-21 DIAGNOSIS — M0579 Rheumatoid arthritis with rheumatoid factor of multiple sites without organ or systems involvement: Secondary | ICD-10-CM | POA: Diagnosis not present

## 2017-03-21 DIAGNOSIS — H2513 Age-related nuclear cataract, bilateral: Secondary | ICD-10-CM | POA: Diagnosis not present

## 2017-03-21 DIAGNOSIS — H52203 Unspecified astigmatism, bilateral: Secondary | ICD-10-CM | POA: Diagnosis not present

## 2017-03-21 DIAGNOSIS — H524 Presbyopia: Secondary | ICD-10-CM | POA: Diagnosis not present

## 2017-03-21 DIAGNOSIS — H16213 Exposure keratoconjunctivitis, bilateral: Secondary | ICD-10-CM | POA: Diagnosis not present

## 2017-03-23 DIAGNOSIS — M810 Age-related osteoporosis without current pathological fracture: Secondary | ICD-10-CM | POA: Diagnosis not present

## 2017-03-23 DIAGNOSIS — E1165 Type 2 diabetes mellitus with hyperglycemia: Secondary | ICD-10-CM | POA: Diagnosis not present

## 2017-03-23 DIAGNOSIS — I10 Essential (primary) hypertension: Secondary | ICD-10-CM | POA: Diagnosis not present

## 2017-03-23 DIAGNOSIS — Z1231 Encounter for screening mammogram for malignant neoplasm of breast: Secondary | ICD-10-CM | POA: Diagnosis not present

## 2017-03-23 DIAGNOSIS — E7800 Pure hypercholesterolemia, unspecified: Secondary | ICD-10-CM | POA: Diagnosis not present

## 2017-03-23 DIAGNOSIS — E559 Vitamin D deficiency, unspecified: Secondary | ICD-10-CM | POA: Diagnosis not present

## 2017-03-23 DIAGNOSIS — Z794 Long term (current) use of insulin: Secondary | ICD-10-CM | POA: Diagnosis not present

## 2017-03-23 DIAGNOSIS — E039 Hypothyroidism, unspecified: Secondary | ICD-10-CM | POA: Diagnosis not present

## 2017-03-26 DIAGNOSIS — M1712 Unilateral primary osteoarthritis, left knee: Secondary | ICD-10-CM | POA: Diagnosis not present

## 2017-03-26 DIAGNOSIS — Z6826 Body mass index (BMI) 26.0-26.9, adult: Secondary | ICD-10-CM | POA: Diagnosis not present

## 2017-04-11 DIAGNOSIS — E785 Hyperlipidemia, unspecified: Secondary | ICD-10-CM | POA: Diagnosis not present

## 2017-04-11 DIAGNOSIS — M7062 Trochanteric bursitis, left hip: Secondary | ICD-10-CM | POA: Diagnosis not present

## 2017-04-16 DIAGNOSIS — R61 Generalized hyperhidrosis: Secondary | ICD-10-CM | POA: Diagnosis not present

## 2017-04-16 DIAGNOSIS — Z6825 Body mass index (BMI) 25.0-25.9, adult: Secondary | ICD-10-CM | POA: Diagnosis not present

## 2017-04-16 DIAGNOSIS — E039 Hypothyroidism, unspecified: Secondary | ICD-10-CM | POA: Diagnosis not present

## 2017-04-19 DIAGNOSIS — E1165 Type 2 diabetes mellitus with hyperglycemia: Secondary | ICD-10-CM | POA: Diagnosis not present

## 2017-04-19 DIAGNOSIS — Z6825 Body mass index (BMI) 25.0-25.9, adult: Secondary | ICD-10-CM | POA: Diagnosis not present

## 2017-04-19 DIAGNOSIS — E1169 Type 2 diabetes mellitus with other specified complication: Secondary | ICD-10-CM | POA: Diagnosis not present

## 2017-04-19 DIAGNOSIS — I10 Essential (primary) hypertension: Secondary | ICD-10-CM | POA: Diagnosis not present

## 2017-04-19 DIAGNOSIS — E785 Hyperlipidemia, unspecified: Secondary | ICD-10-CM | POA: Diagnosis not present

## 2017-04-26 ENCOUNTER — Other Ambulatory Visit: Payer: Self-pay | Admitting: Neurosurgery

## 2017-05-03 ENCOUNTER — Encounter (HOSPITAL_COMMUNITY)
Admission: RE | Admit: 2017-05-03 | Discharge: 2017-05-03 | Disposition: A | Discharge status: Home / Self Care | Payer: PPO | Source: Ambulatory Visit | Attending: Neurosurgery | Admitting: Neurosurgery

## 2017-05-03 ENCOUNTER — Other Ambulatory Visit (HOSPITAL_COMMUNITY): Payer: BC Managed Care – PPO

## 2017-05-03 ENCOUNTER — Encounter (HOSPITAL_COMMUNITY): Payer: Self-pay

## 2017-05-03 ENCOUNTER — Other Ambulatory Visit (HOSPITAL_COMMUNITY): Payer: Self-pay | Admitting: *Deleted

## 2017-05-03 DIAGNOSIS — Z01810 Encounter for preprocedural cardiovascular examination: Secondary | ICD-10-CM | POA: Diagnosis not present

## 2017-05-03 DIAGNOSIS — M4802 Spinal stenosis, cervical region: Secondary | ICD-10-CM | POA: Insufficient documentation

## 2017-05-03 DIAGNOSIS — Z01812 Encounter for preprocedural laboratory examination: Secondary | ICD-10-CM | POA: Insufficient documentation

## 2017-05-03 HISTORY — DX: Essential (primary) hypertension: I10

## 2017-05-03 HISTORY — DX: Hypothyroidism, unspecified: E03.9

## 2017-05-03 HISTORY — DX: Personal history of urinary calculi: Z87.442

## 2017-05-03 HISTORY — DX: Barrett's esophagus without dysplasia: K22.70

## 2017-05-03 LAB — CBC WITH DIFFERENTIAL/PLATELET
BASOS ABS: 0 10*3/uL (ref 0.0–0.1)
BASOS PCT: 0 %
EOS ABS: 0.3 10*3/uL (ref 0.0–0.7)
Eosinophils Relative: 3 %
HEMATOCRIT: 43.8 % (ref 36.0–46.0)
HEMOGLOBIN: 14.6 g/dL (ref 12.0–15.0)
Lymphocytes Relative: 30 %
Lymphs Abs: 2.2 10*3/uL (ref 0.7–4.0)
MCH: 30.9 pg (ref 26.0–34.0)
MCHC: 33.3 g/dL (ref 30.0–36.0)
MCV: 92.6 fL (ref 78.0–100.0)
MONO ABS: 0.3 10*3/uL (ref 0.1–1.0)
Monocytes Relative: 4 %
NEUTROS ABS: 4.5 10*3/uL (ref 1.7–7.7)
NEUTROS PCT: 63 %
Platelets: 173 10*3/uL (ref 150–400)
RBC: 4.73 MIL/uL (ref 3.87–5.11)
RDW: 12.8 % (ref 11.5–15.5)
WBC: 7.3 10*3/uL (ref 4.0–10.5)

## 2017-05-03 LAB — BASIC METABOLIC PANEL
ANION GAP: 7 (ref 5–15)
BUN: 13 mg/dL (ref 6–20)
CALCIUM: 9 mg/dL (ref 8.9–10.3)
CO2: 27 mmol/L (ref 22–32)
Chloride: 106 mmol/L (ref 101–111)
Creatinine, Ser: 0.91 mg/dL (ref 0.44–1.00)
Glucose, Bld: 101 mg/dL — ABNORMAL HIGH (ref 65–99)
Potassium: 4.2 mmol/L (ref 3.5–5.1)
Sodium: 140 mmol/L (ref 135–145)

## 2017-05-03 LAB — SURGICAL PCR SCREEN
MRSA, PCR: NEGATIVE
Staphylococcus aureus: NEGATIVE

## 2017-05-03 LAB — GLUCOSE, CAPILLARY: GLUCOSE-CAPILLARY: 128 mg/dL — AB (ref 65–99)

## 2017-05-03 NOTE — Pre-Procedure Instructions (Signed)
Sara Diaz  05/03/2017      [place] [facility], [place] - [address] [place] [address] Phone: [phone] Fax: [phone]   Your procedure is scheduled on Thursday, May 17, 2017 at 10:40 AM.   Report to [facility] Entrance "A" Admitting Office at 8:40 AM.   Call this number if you have problems the morning of surgery: [phone]   Questions prior to day of surgery, please call [phone] between 8 & 4 PM.   Remember:  Do not eat food or drink liquids after midnight Wednesday, 05/16/17.  Take these medicines the morning of surgery with A SIP OF WATER: Estradiol (Estrace), Lansoprazole (Prevacid), Levothyroxine (Synthroid), may use eye drops  Stop NSAIDS (Ibuprofen, Aleve, etc) and Multivitamins 5 days prior to surgery. Do not use Aspirin products 5 days prior to surgery.   How to Manage Your Diabetes Before Surgery  Why is it important to control my blood sugar before and after surgery?   Improving blood sugar levels before and after surgery helps healing and can limit problems.  A way of improving blood sugar control is eating a healthy diet by:  - Eating less sugar and carbohydrates  - Increasing activity/exercise  - Talk with your doctor about reaching your blood sugar goals  High blood sugars (greater than 180 mg/dL) can raise your risk of infections and slow down your recovery so you will need to focus on controlling your diabetes during the weeks before surgery.  Make sure that the doctor who takes care of your diabetes knows about your planned surgery including the date and location.  How do I manage my blood sugars before surgery?   Check your blood sugar at least 4 times a day, 2 days before surgery to make sure that they are not too high or low.  Check your blood sugar the morning of your surgery when you wake up and every 2 hours until you get to the Short-Stay unit.  Treat a low blood sugar (less  than 70 mg/dL) with 1/2 cup of clear juice (cranberry or apple), 4 glucose tablets, OR glucose gel.  Recheck blood sugar in 15 minutes after treatment (to make sure it is greater than 70 mg/dL).  If blood sugar is not greater than 70 mg/dL on re-check, call [phone] for further instructions.   Report your blood sugar to the Short-Stay nurse when you get to Short-Stay.  References:  [facility], 2007 "How to Manage your Diabetes Before and After Surgery".  What do I do about my diabetes medications?   THE MORNING OF SURGERY, take 15 units of Levemir Insulin.   Do not take other diabetes injectables the day of surgery including Byetta, Victoza, Bydureon, and Trulicity.   Do not wear jewelry, make-up or nail polish.  Do not wear lotions, powders, perfumes or deoderant.  Do not shave 48 hours prior to surgery.   Do not bring valuables to the hospital.  [facility] is not responsible for any belongings or valuables.  Contacts, dentures or bridgework may not be worn into surgery.  Leave your suitcase in the car.  After surgery it may be brought to your room.  For patients admitted to the hospital, discharge time will be determined by your treatment team.  Patients discharged the day of surgery will not be allowed to drive home.   Special instructions:  See "Preparing for Surgery" Instruction sheet.  Please read  over the fact sheets that you were given.

## 2017-05-03 NOTE — Progress Notes (Addendum)
PCP:Dr. Stevenson Clinch @ [facility] in [place]: Dr. Doristine Johns Rheumatologist: Dr. Florene Glen [facility]  Fasting sugars 130  Pt. Will stop methotrexate and oriencia 1 week prior to surgery per Dr. Virgina Norfolk instruction.

## 2017-05-04 LAB — HEMOGLOBIN A1C
Hgb A1c MFr Bld: 6.8 % — ABNORMAL HIGH (ref 4.8–5.6)
Mean Plasma Glucose: 148 mg/dL

## 2017-05-17 ENCOUNTER — Inpatient Hospital Stay (HOSPITAL_COMMUNITY)
Admission: RE | Disposition: A | Discharge status: Home / Self Care | Payer: Self-pay | Source: Ambulatory Visit | Attending: Neurosurgery

## 2017-05-17 ENCOUNTER — Inpatient Hospital Stay (HOSPITAL_COMMUNITY)
Admission: RE | Admit: 2017-05-17 | Discharge: 2017-05-18 | DRG: 455 | Disposition: A | Discharge status: Home / Self Care | Payer: PPO | Source: Ambulatory Visit | Attending: Neurosurgery | Admitting: Neurosurgery

## 2017-05-17 ENCOUNTER — Inpatient Hospital Stay (HOSPITAL_COMMUNITY): Payer: PPO | Admitting: Vascular Surgery

## 2017-05-17 ENCOUNTER — Inpatient Hospital Stay (HOSPITAL_COMMUNITY): Payer: PPO

## 2017-05-17 ENCOUNTER — Encounter (HOSPITAL_COMMUNITY): Payer: Self-pay | Admitting: Certified Registered Nurse Anesthetist

## 2017-05-17 ENCOUNTER — Inpatient Hospital Stay (HOSPITAL_COMMUNITY): Payer: PPO | Admitting: Anesthesiology

## 2017-05-17 DIAGNOSIS — M4802 Spinal stenosis, cervical region: Secondary | ICD-10-CM | POA: Diagnosis not present

## 2017-05-17 DIAGNOSIS — M4322 Fusion of spine, cervical region: Secondary | ICD-10-CM | POA: Diagnosis not present

## 2017-05-17 DIAGNOSIS — M50322 Other cervical disc degeneration at C5-C6 level: Secondary | ICD-10-CM | POA: Diagnosis present

## 2017-05-17 DIAGNOSIS — K2270 Barrett's esophagus without dysplasia: Secondary | ICD-10-CM | POA: Diagnosis present

## 2017-05-17 DIAGNOSIS — Z79899 Other long term (current) drug therapy: Secondary | ICD-10-CM

## 2017-05-17 DIAGNOSIS — M069 Rheumatoid arthritis, unspecified: Secondary | ICD-10-CM | POA: Diagnosis present

## 2017-05-17 DIAGNOSIS — R7303 Prediabetes: Secondary | ICD-10-CM | POA: Diagnosis present

## 2017-05-17 DIAGNOSIS — M47812 Spondylosis without myelopathy or radiculopathy, cervical region: Secondary | ICD-10-CM | POA: Diagnosis not present

## 2017-05-17 DIAGNOSIS — Z888 Allergy status to other drugs, medicaments and biological substances status: Secondary | ICD-10-CM | POA: Diagnosis not present

## 2017-05-17 DIAGNOSIS — M19049 Primary osteoarthritis, unspecified hand: Secondary | ICD-10-CM | POA: Diagnosis present

## 2017-05-17 DIAGNOSIS — Z981 Arthrodesis status: Secondary | ICD-10-CM | POA: Diagnosis not present

## 2017-05-17 DIAGNOSIS — Z882 Allergy status to sulfonamides status: Secondary | ICD-10-CM | POA: Diagnosis not present

## 2017-05-17 DIAGNOSIS — M1990 Unspecified osteoarthritis, unspecified site: Secondary | ICD-10-CM | POA: Diagnosis not present

## 2017-05-17 DIAGNOSIS — Z91041 Radiographic dye allergy status: Secondary | ICD-10-CM | POA: Diagnosis not present

## 2017-05-17 DIAGNOSIS — Z96692 Finger-joint replacement of left hand: Secondary | ICD-10-CM | POA: Diagnosis not present

## 2017-05-17 DIAGNOSIS — I10 Essential (primary) hypertension: Secondary | ICD-10-CM | POA: Diagnosis not present

## 2017-05-17 DIAGNOSIS — Z880 Allergy status to penicillin: Secondary | ICD-10-CM | POA: Diagnosis not present

## 2017-05-17 DIAGNOSIS — Z419 Encounter for procedure for purposes other than remedying health state, unspecified: Secondary | ICD-10-CM

## 2017-05-17 DIAGNOSIS — E039 Hypothyroidism, unspecified: Secondary | ICD-10-CM | POA: Diagnosis not present

## 2017-05-17 DIAGNOSIS — K219 Gastro-esophageal reflux disease without esophagitis: Secondary | ICD-10-CM | POA: Diagnosis present

## 2017-05-17 HISTORY — DX: Pure hypercholesterolemia, unspecified: E78.00

## 2017-05-17 HISTORY — DX: Depression, unspecified: F32.A

## 2017-05-17 HISTORY — DX: Deep phlebothrombosis in pregnancy, unspecified trimester: O22.30

## 2017-05-17 HISTORY — DX: Age-related osteoporosis without current pathological fracture: M81.0

## 2017-05-17 HISTORY — DX: Pneumonia, unspecified organism: J18.9

## 2017-05-17 HISTORY — DX: Type 2 diabetes mellitus without complications: E11.9

## 2017-05-17 HISTORY — PX: ANTERIOR CERVICAL DECOMP/DISCECTOMY FUSION: SHX1161

## 2017-05-17 HISTORY — DX: Major depressive disorder, single episode, unspecified: F32.9

## 2017-05-17 HISTORY — DX: Other pulmonary embolism without acute cor pulmonale: I26.99

## 2017-05-17 LAB — GLUCOSE, CAPILLARY
GLUCOSE-CAPILLARY: 269 mg/dL — AB (ref 65–99)
Glucose-Capillary: 100 mg/dL — ABNORMAL HIGH (ref 65–99)
Glucose-Capillary: 93 mg/dL (ref 65–99)

## 2017-05-17 SURGERY — ANTERIOR CERVICAL DECOMPRESSION/DISCECTOMY FUSION 2 LEVELS
Anesthesia: General | Site: Neck

## 2017-05-17 MED ORDER — PHENYLEPHRINE 40 MCG/ML (10ML) SYRINGE FOR IV PUSH (FOR BLOOD PRESSURE SUPPORT)
PREFILLED_SYRINGE | INTRAVENOUS | Status: AC
  Filled 2017-05-17: qty 10

## 2017-05-17 MED ORDER — PANTOPRAZOLE SODIUM 40 MG PO TBEC
40.0000 mg | DELAYED_RELEASE_TABLET | Freq: Every day | ORAL | Status: DC
  Administered 2017-05-17 – 2017-05-18 (×2): 40 mg via ORAL
  Filled 2017-05-17 (×2): qty 1

## 2017-05-17 MED ORDER — CHLORHEXIDINE GLUCONATE CLOTH 2 % EX PADS: 6.0000 | MEDICATED_PAD | Freq: Once | CUTANEOUS | Status: DC

## 2017-05-17 MED ORDER — DEXAMETHASONE SODIUM PHOSPHATE 10 MG/ML IJ SOLN
10.0000 mg | INTRAMUSCULAR | Status: AC
  Administered 2017-05-17: 10 mg via INTRAVENOUS
  Filled 2017-05-17: qty 1

## 2017-05-17 MED ORDER — INSULIN ASPART 100 UNIT/ML ~~LOC~~ SOLN
0.0000 [IU] | Freq: Three times a day (TID) | SUBCUTANEOUS | Status: DC
  Administered 2017-05-18: 5 [IU] via SUBCUTANEOUS

## 2017-05-17 MED ORDER — PROPOFOL 10 MG/ML IV BOLUS
INTRAVENOUS | Status: DC | PRN
  Administered 2017-05-17: 150 mg via INTRAVENOUS

## 2017-05-17 MED ORDER — PANTOPRAZOLE SODIUM 40 MG PO TBEC: 40.0000 mg | DELAYED_RELEASE_TABLET | Freq: Every day | ORAL | Status: DC

## 2017-05-17 MED ORDER — HEMOSTATIC AGENTS (NO CHARGE) OPTIME
TOPICAL | Status: DC | PRN
  Administered 2017-05-17: 1 via TOPICAL

## 2017-05-17 MED ORDER — CEFAZOLIN SODIUM-DEXTROSE 2-4 GM/100ML-% IV SOLN
INTRAVENOUS | Status: AC
  Filled 2017-05-17: qty 100

## 2017-05-17 MED ORDER — HYDROCODONE-ACETAMINOPHEN 5-325 MG PO TABS
1.0000 | ORAL_TABLET | ORAL | Status: DC | PRN
  Administered 2017-05-17: 2 via ORAL
  Administered 2017-05-17 – 2017-05-18 (×2): 1 via ORAL
  Filled 2017-05-17: qty 2
  Filled 2017-05-17: qty 1

## 2017-05-17 MED ORDER — ONDANSETRON HCL 4 MG/2ML IJ SOLN: 4.0000 mg | Freq: Four times a day (QID) | INTRAMUSCULAR | Status: DC | PRN

## 2017-05-17 MED ORDER — SODIUM CHLORIDE 0.9% FLUSH: 3.0000 mL | Freq: Two times a day (BID) | INTRAVENOUS | Status: DC

## 2017-05-17 MED ORDER — MENTHOL 3 MG MT LOZG: 1.0000 | LOZENGE | OROMUCOSAL | Status: DC | PRN

## 2017-05-17 MED ORDER — FENTANYL CITRATE (PF) 100 MCG/2ML IJ SOLN
25.0000 ug | INTRAMUSCULAR | Status: DC | PRN
  Administered 2017-05-17: 75 ug via INTRAVENOUS
  Administered 2017-05-17: 50 ug via INTRAVENOUS
  Administered 2017-05-17: 25 ug via INTRAVENOUS

## 2017-05-17 MED ORDER — AMITRIPTYLINE HCL 25 MG PO TABS
50.0000 mg | ORAL_TABLET | Freq: Every day | ORAL | Status: DC
  Administered 2017-05-17: 50 mg via ORAL
  Filled 2017-05-17: qty 2

## 2017-05-17 MED ORDER — ONDANSETRON HCL 4 MG/2ML IJ SOLN
INTRAMUSCULAR | Status: DC | PRN
  Administered 2017-05-17: 4 mg via INTRAVENOUS

## 2017-05-17 MED ORDER — NADOLOL 80 MG PO TABS
120.0000 mg | ORAL_TABLET | Freq: Every evening | ORAL | Status: DC
  Filled 2017-05-17: qty 1

## 2017-05-17 MED ORDER — PROMETHAZINE HCL 25 MG/ML IJ SOLN
INTRAMUSCULAR | Status: AC
  Filled 2017-05-17: qty 1

## 2017-05-17 MED ORDER — ABATACEPT 125 MG/ML ~~LOC~~ SOSY: 125.0000 mg | PREFILLED_SYRINGE | SUBCUTANEOUS | Status: DC

## 2017-05-17 MED ORDER — FENTANYL CITRATE (PF) 250 MCG/5ML IJ SOLN
INTRAMUSCULAR | Status: AC
  Filled 2017-05-17: qty 5

## 2017-05-17 MED ORDER — ROCURONIUM BROMIDE 10 MG/ML (PF) SYRINGE
PREFILLED_SYRINGE | INTRAVENOUS | Status: DC | PRN
  Administered 2017-05-17: 30 mg via INTRAVENOUS
  Administered 2017-05-17: 10 mg via INTRAVENOUS
  Administered 2017-05-17: 20 mg via INTRAVENOUS

## 2017-05-17 MED ORDER — LACTATED RINGERS IV SOLN
INTRAVENOUS | Status: DC
  Administered 2017-05-17: 15:00:00 via INTRAVENOUS
  Administered 2017-05-17: 50 mL/h via INTRAVENOUS

## 2017-05-17 MED ORDER — ONDANSETRON HCL 4 MG/2ML IJ SOLN
INTRAMUSCULAR | Status: AC
  Filled 2017-05-17: qty 2

## 2017-05-17 MED ORDER — SODIUM CHLORIDE 0.9% FLUSH: 3.0000 mL | INTRAVENOUS | Status: DC | PRN

## 2017-05-17 MED ORDER — ADULT GUMMY PO CHEW: CHEWABLE_TABLET | Freq: Every day | ORAL | Status: DC

## 2017-05-17 MED ORDER — FENTANYL CITRATE (PF) 250 MCG/5ML IJ SOLN
INTRAMUSCULAR | Status: DC | PRN
  Administered 2017-05-17: 50 ug via INTRAVENOUS
  Administered 2017-05-17 (×2): 25 ug via INTRAVENOUS
  Administered 2017-05-17 (×3): 50 ug via INTRAVENOUS

## 2017-05-17 MED ORDER — PRAVASTATIN SODIUM 40 MG PO TABS
60.0000 mg | ORAL_TABLET | Freq: Every day | ORAL | Status: DC
  Administered 2017-05-17: 60 mg via ORAL
  Filled 2017-05-17: qty 1

## 2017-05-17 MED ORDER — CYCLOBENZAPRINE HCL 10 MG PO TABS
ORAL_TABLET | ORAL | Status: AC
  Filled 2017-05-17: qty 1

## 2017-05-17 MED ORDER — ONDANSETRON HCL 4 MG/2ML IJ SOLN
4.0000 mg | Freq: Once | INTRAMUSCULAR | Status: DC | PRN
  Administered 2017-05-17: 4 mg via INTRAVENOUS

## 2017-05-17 MED ORDER — PROPOFOL 10 MG/ML IV BOLUS
INTRAVENOUS | Status: AC
  Filled 2017-05-17: qty 20

## 2017-05-17 MED ORDER — THROMBIN 5000 UNITS EX SOLR
CUTANEOUS | Status: DC | PRN
  Administered 2017-05-17 (×2): 5000 [IU] via TOPICAL

## 2017-05-17 MED ORDER — HYDROCODONE-ACETAMINOPHEN 5-325 MG PO TABS
ORAL_TABLET | ORAL | Status: AC
  Filled 2017-05-17: qty 1

## 2017-05-17 MED ORDER — CHOLECALCIFEROL 100 MCG (4000 UT) PO CAPS
4000.0000 [IU] | ORAL_CAPSULE | Freq: Every day | ORAL | Status: DC
  Filled 2017-05-17: qty 1

## 2017-05-17 MED ORDER — FOLIC ACID 1 MG PO TABS
1000.0000 ug | ORAL_TABLET | Freq: Every day | ORAL | Status: DC
  Administered 2017-05-18: 1 mg via ORAL
  Filled 2017-05-17: qty 1

## 2017-05-17 MED ORDER — FENTANYL CITRATE (PF) 100 MCG/2ML IJ SOLN
INTRAMUSCULAR | Status: AC
  Filled 2017-05-17: qty 2

## 2017-05-17 MED ORDER — SODIUM CHLORIDE 0.9 % IR SOLN
Status: DC | PRN
  Administered 2017-05-17: 500 mL

## 2017-05-17 MED ORDER — PHENYLEPHRINE HCL 10 MG/ML IJ SOLN
INTRAVENOUS | Status: DC | PRN
  Administered 2017-05-17: 10 ug/min via INTRAVENOUS

## 2017-05-17 MED ORDER — CYCLOSPORINE 0.05 % OP EMUL
1.0000 [drp] | Freq: Two times a day (BID) | OPHTHALMIC | Status: DC
  Administered 2017-05-17: 1 [drp] via OPHTHALMIC
  Filled 2017-05-17 (×2): qty 1

## 2017-05-17 MED ORDER — LEVOTHYROXINE SODIUM 112 MCG PO TABS
112.0000 ug | ORAL_TABLET | Freq: Every day | ORAL | Status: DC
Start: 2017-05-18 — End: 2017-05-18
  Administered 2017-05-18: 112 ug via ORAL
  Filled 2017-05-17: qty 1

## 2017-05-17 MED ORDER — HYDROMORPHONE HCL 1 MG/ML IJ SOLN: 0.5000 mg | INTRAMUSCULAR | Status: DC | PRN

## 2017-05-17 MED ORDER — SUGAMMADEX SODIUM 200 MG/2ML IV SOLN
INTRAVENOUS | Status: DC | PRN
  Administered 2017-05-17: 200 mg via INTRAVENOUS

## 2017-05-17 MED ORDER — INSULIN DETEMIR 100 UNIT/ML FLEXPEN: 30.0000 [IU] | PEN_INJECTOR | Freq: Every day | SUBCUTANEOUS | Status: DC

## 2017-05-17 MED ORDER — LIDOCAINE 2% (20 MG/ML) 5 ML SYRINGE
INTRAMUSCULAR | Status: DC | PRN
  Administered 2017-05-17: 80 mg via INTRAVENOUS

## 2017-05-17 MED ORDER — CYCLOBENZAPRINE HCL 10 MG PO TABS
10.0000 mg | ORAL_TABLET | Freq: Three times a day (TID) | ORAL | Status: DC | PRN
  Administered 2017-05-17: 10 mg via ORAL

## 2017-05-17 MED ORDER — IBUPROFEN 200 MG PO TABS: 400.0000 mg | ORAL_TABLET | Freq: Three times a day (TID) | ORAL | Status: DC | PRN

## 2017-05-17 MED ORDER — VANCOMYCIN HCL IN DEXTROSE 1-5 GM/200ML-% IV SOLN
1000.0000 mg | INTRAVENOUS | Status: AC
  Administered 2017-05-17: 1000 mg via INTRAVENOUS

## 2017-05-17 MED ORDER — ESTRADIOL 1 MG PO TABS
0.5000 mg | ORAL_TABLET | Freq: Every day | ORAL | Status: DC
  Administered 2017-05-18: 0.5 mg via ORAL
  Filled 2017-05-17: qty 0.5

## 2017-05-17 MED ORDER — INSULIN DETEMIR 100 UNIT/ML ~~LOC~~ SOLN
30.0000 [IU] | Freq: Every day | SUBCUTANEOUS | Status: DC
  Administered 2017-05-18: 30 [IU] via SUBCUTANEOUS
  Filled 2017-05-17: qty 0.3

## 2017-05-17 MED ORDER — VANCOMYCIN HCL IN DEXTROSE 1-5 GM/200ML-% IV SOLN
1000.0000 mg | Freq: Once | INTRAVENOUS | Status: AC
  Administered 2017-05-18: 1000 mg via INTRAVENOUS
  Filled 2017-05-17: qty 200

## 2017-05-17 MED ORDER — THROMBIN 5000 UNITS EX SOLR
CUTANEOUS | Status: AC
  Filled 2017-05-17: qty 15000

## 2017-05-17 MED ORDER — 0.9 % SODIUM CHLORIDE (POUR BTL) OPTIME
TOPICAL | Status: DC | PRN
  Administered 2017-05-17: 1000 mL

## 2017-05-17 MED ORDER — INSULIN DETEMIR 100 UNIT/ML ~~LOC~~ SOLN
35.0000 [IU] | Freq: Every day | SUBCUTANEOUS | Status: DC
  Filled 2017-05-17: qty 0.35

## 2017-05-17 MED ORDER — PHENOL 1.4 % MT LIQD: 1.0000 | OROMUCOSAL | Status: DC | PRN

## 2017-05-17 MED ORDER — ONDANSETRON HCL 4 MG PO TABS: 4.0000 mg | ORAL_TABLET | Freq: Four times a day (QID) | ORAL | Status: DC | PRN

## 2017-05-17 MED ORDER — SODIUM CHLORIDE 0.9 % IV SOLN: 250.0000 mL | INTRAVENOUS | Status: DC

## 2017-05-17 MED ORDER — DULAGLUTIDE 0.75 MG/0.5ML ~~LOC~~ SOAJ: 0.7500 mg | SUBCUTANEOUS | Status: DC

## 2017-05-17 SURGICAL SUPPLY — 56 items
BAG DECANTER FOR FLEXI CONT (MISCELLANEOUS) ×2 IMPLANT
BENZOIN TINCTURE PRP APPL 2/3 (GAUZE/BANDAGES/DRESSINGS) ×2 IMPLANT
BIT DRILL 13 (BIT) ×2 IMPLANT
BUR MATCHSTICK NEURO 3.0 LAGG (BURR) ×2 IMPLANT
CAGE PEEK 6X14X11 (Cage) ×1 IMPLANT
CANISTER SUCT 3000ML PPV (MISCELLANEOUS) ×2 IMPLANT
CARTRIDGE OIL MAESTRO DRILL (MISCELLANEOUS) ×1 IMPLANT
CLSR STERI-STRIP ANTIMIC 1/2X4 (GAUZE/BANDAGES/DRESSINGS) ×2 IMPLANT
DIFFUSER DRILL AIR PNEUMATIC (MISCELLANEOUS) ×2 IMPLANT
DRAPE C-ARM 42X72 X-RAY (DRAPES) ×4 IMPLANT
DRAPE LAPAROTOMY 100X72 PEDS (DRAPES) ×2 IMPLANT
DRAPE MICROSCOPE LEICA (MISCELLANEOUS) ×2 IMPLANT
DRAPE POUCH INSTRU U-SHP 10X18 (DRAPES) ×2 IMPLANT
DURAPREP 6ML APPLICATOR 50/CS (WOUND CARE) ×2 IMPLANT
ELECT COATED BLADE 2.86 ST (ELECTRODE) ×2 IMPLANT
ELECT REM PT RETURN 9FT ADLT (ELECTROSURGICAL) ×2
ELECTRODE REM PT RTRN 9FT ADLT (ELECTROSURGICAL) ×1 IMPLANT
GAUZE SPONGE 4X4 12PLY STRL (GAUZE/BANDAGES/DRESSINGS) IMPLANT
GAUZE SPONGE 4X4 12PLY STRL LF (GAUZE/BANDAGES/DRESSINGS) ×2 IMPLANT
GAUZE SPONGE 4X4 16PLY XRAY LF (GAUZE/BANDAGES/DRESSINGS) IMPLANT
GLOVE ECLIPSE 9.0 STRL (GLOVE) ×4 IMPLANT
GLOVE EXAM NITRILE LRG STRL (GLOVE) IMPLANT
GLOVE EXAM NITRILE XL STR (GLOVE) IMPLANT
GLOVE EXAM NITRILE XS STR PU (GLOVE) IMPLANT
GOWN STRL REUS W/ TWL LRG LVL3 (GOWN DISPOSABLE) ×1 IMPLANT
GOWN STRL REUS W/ TWL XL LVL3 (GOWN DISPOSABLE) ×1 IMPLANT
GOWN STRL REUS W/TWL 2XL LVL3 (GOWN DISPOSABLE) IMPLANT
GOWN STRL REUS W/TWL LRG LVL3 (GOWN DISPOSABLE) ×1
GOWN STRL REUS W/TWL XL LVL3 (GOWN DISPOSABLE) ×1
HALTER HD/CHIN CERV TRACTION D (MISCELLANEOUS) ×2 IMPLANT
HEMOSTAT SURGICEL 2X14 (HEMOSTASIS) IMPLANT
KIT BASIN OR (CUSTOM PROCEDURE TRAY) ×2 IMPLANT
KIT ROOM TURNOVER OR (KITS) ×2 IMPLANT
NEEDLE SPNL 20GX3.5 QUINCKE YW (NEEDLE) ×2 IMPLANT
NS IRRIG 1000ML POUR BTL (IV SOLUTION) ×2 IMPLANT
OIL CARTRIDGE MAESTRO DRILL (MISCELLANEOUS) ×2
PACK LAMINECTOMY NEURO (CUSTOM PROCEDURE TRAY) ×2 IMPLANT
PAD ARMBOARD 7.5X6 YLW CONV (MISCELLANEOUS) ×10 IMPLANT
PEEK CAGE 7X14X11 (Cage) ×2 IMPLANT
PLATE VISION ELITE 40MM (Plate) ×2 IMPLANT
RUBBERBAND STERILE (MISCELLANEOUS) ×4 IMPLANT
SCREW ST 13X4XST VA NS SPNE (Screw) ×6 IMPLANT
SCREW ST VAR 4 ATL (Screw) ×6 IMPLANT
SPACER SPNL 11X14X6XPEEK CVD (Cage) ×1 IMPLANT
SPCR SPNL 11X14X6XPEEK CVD (Cage) ×1 IMPLANT
SPONGE INTESTINAL PEANUT (DISPOSABLE) ×2 IMPLANT
SPONGE SURGIFOAM ABS GEL SZ50 (HEMOSTASIS) ×2 IMPLANT
STRIP CLOSURE SKIN 1/2X4 (GAUZE/BANDAGES/DRESSINGS) ×2 IMPLANT
SUT VIC AB 3-0 SH 8-18 (SUTURE) ×2 IMPLANT
SUT VIC AB 4-0 RB1 18 (SUTURE) ×2 IMPLANT
TAPE CLOTH 4X10 WHT NS (GAUZE/BANDAGES/DRESSINGS) IMPLANT
TAPE CLOTH SURG 4X10 WHT LF (GAUZE/BANDAGES/DRESSINGS) ×2 IMPLANT
TOWEL GREEN STERILE (TOWEL DISPOSABLE) ×2 IMPLANT
TOWEL GREEN STERILE FF (TOWEL DISPOSABLE) ×2 IMPLANT
TRAP SPECIMEN MUCOUS 40CC (MISCELLANEOUS) ×2 IMPLANT
WATER STERILE IRR 1000ML POUR (IV SOLUTION) ×2 IMPLANT

## 2017-05-17 NOTE — Brief Op Note (Signed)
05/17/2017  2:54 PM  PATIENT:  Sara Diaz  71 y.o. female  PRE-OPERATIVE DIAGNOSIS:  Stenosis  POST-OPERATIVE DIAGNOSIS:  Stenosis  PROCEDURE:  Procedure(s): Anterior Cervical Decompression/Discectomy Fusion - Cervical four-Cervical five - Cervical five-Cervical six (N/A)  SURGEON:  Surgeon(s) and Role:    * Earnie Larsson, MD - Primary    * Erline Levine, MD - Assisting  PHYSICIAN ASSISTANT:   ASSISTANTS:    ANESTHESIA:   general  EBL:  Total I/O In: 800 [I.V.:800] Out: 100 [Blood:100]  BLOOD ADMINISTERED:none  DRAINS: none   LOCAL MEDICATIONS USED:  NONE  SPECIMEN:  No Specimen  DISPOSITION OF SPECIMEN:  N/A  COUNTS:  YES  TOURNIQUET:  * No tourniquets in log *  DICTATION: .Dragon Dictation  PLAN OF CARE: Admit to inpatient   PATIENT DISPOSITION:  PACU - hemodynamically stable.   Delay start of Pharmacological VTE agent (>24hrs) due to surgical blood loss or risk of bleeding: yes

## 2017-05-17 NOTE — Anesthesia Postprocedure Evaluation (Signed)
Anesthesia Post Note  Patient: Sara Diaz  Procedure(s) Performed: Procedure(s) (LRB): Anterior Cervical Decompression/Discectomy Fusion - Cervical four-Cervical five - Cervical five-Cervical six (N/A)     Patient location during evaluation: PACU Anesthesia Type: General Level of consciousness: awake and alert Pain management: pain level controlled Vital Signs Assessment: post-procedure vital signs reviewed and stable Respiratory status: spontaneous breathing, nonlabored ventilation, respiratory function stable and patient connected to nasal cannula oxygen Cardiovascular status: blood pressure returned to baseline and stable Postop Assessment: no signs of nausea or vomiting Anesthetic complications: no    Last Vitals:  Vitals:   05/17/17 1555 05/17/17 1629  BP:  (!) 163/78  Pulse: (!) 59 70  Resp: 13   Temp:      Last Pain:  Vitals:   05/17/17 1546  TempSrc:   PainSc: 6                  ,W. EDMOND

## 2017-05-17 NOTE — Anesthesia Procedure Notes (Addendum)
Procedure Name: Intubation Date/Time: 05/17/2017 1:20 PM Performed by: Mervyn Gay Pre-anesthesia Checklist: Patient identified, Patient being monitored, Timeout performed, Emergency Drugs available and Suction available Patient Re-evaluated:Patient Re-evaluated prior to induction Oxygen Delivery Method: Circle System Utilized Preoxygenation: Pre-oxygenation with 100% oxygen Induction Type: IV induction Ventilation: Mask ventilation without difficulty Laryngoscope Size: Miller and 3 Grade View: Grade III Tube type: Oral Tube size: 7.0 mm Number of attempts: 1 Airway Equipment and Method: Stylet Placement Confirmation: ETT inserted through vocal cords under direct vision,  positive ETCO2 and breath sounds checked- equal and bilateral Secured at: 22 cm Tube secured with: Tape Dental Injury: Teeth and Oropharynx as per pre-operative assessment  Comments: Head positioned for comfort by pt prior to induction with minimal manipulation during DL.

## 2017-05-17 NOTE — H&P (Signed)
Sara Diaz is an 71 y.o. female.   Chief Complaint: Neck pain HPI: 71 year old female with progressive neck pain and left upper chamois symptoms failing conservative management. Workup demonstrates evidence of significant disc degeneration with associated spondylosis and stenosis at C4-5 and C5-6. Patient presents now for two-level anterior cervical decompression infusion in hopes of improving her symptoms.  Past Medical History:  Diagnosis Date  . Arthritis    neck ,hands  . Barrett esophagus   . Diabetes mellitus    prediabetic.lost 20 lbs.off  metformin x 1 yr  . GERD (gastroesophageal reflux disease)    off prilosec .developed cough .thought to be allergic  to prilosec  . Headache(784.0)   . History of kidney stones 2006,2007  . Hx of blood clots 1973   in lungs x3  . Hypertension   . Hypothyroidism   . Neuromuscular disorder (HCC)    weakness r leg.  . RA (rheumatoid arthritis) ([place])   . Renal calculi    bladder tack,cysto for kidney stones    Past Surgical History:  Procedure Laterality Date  . ABDOMINAL ADHESION SURGERY  1992  . ABDOMINAL HYSTERECTOMY  1990  . APPENDECTOMY    . BACK SURGERY    . BLADDER REPAIR  1993  . BREAST SURGERY  1968   2 tumors on breast -benign  . CERVICAL FUSION    . CHOLECYSTECTOMY    . CYST EXCISION  2001   left index finger  . CYSTO X 3    . DILATION AND CURETTAGE OF UTERUS  1984  . ENTEROCELE REPAIR    . HAND SURGERY    . KNEE ARTHROSCOPY    . LAMINECTOMY  09/04/2011   Procedure: LUMBAR LAMINECTOMY FOR TUMOR;  Surgeon: Cooper Render ;  Location: [facility];  Service: Neurosurgery;  Laterality: N/A;  Lumbar five laminectomy for resection of intradural tumor  . ruptured tendon on left thumb  2017  . THUMB ARTHROSCOPY     replaced left thumb joint  . TUMOR EXCISION  2012   lower spinal sac- benign    History reviewed. No pertinent family history. Social History:  reports that she has never smoked. She has never used  smokeless tobacco. She reports that she does not drink alcohol or use drugs.  Allergies:  Allergies  Allergen Reactions  . Etanercept Other (See Comments)    Excessive sweating  . Folic Acid Itching  . Other Other (See Comments) and Swelling    Seizures  . Sulfamethoxazole-Trimethoprim Itching  . Lisinopril Other (See Comments)  . Nitrofurantoin Rash    'has red dye in it' 'has red dye in it'  . Nitrofurantoin Macrocrystal Other (See Comments)  . Canagliflozin Other (See Comments)    Constant yeast infection  . Contrast Media [Iodinated Diagnostic Agents] Other (See Comments)    Convulsions  . Fluoxetine Other (See Comments)    Depression  . Ioxaglate Other (See Comments)    Convulsions  . Nabumetone Other (See Comments)  . Red Dye Other (See Comments)    unknown  . Adalimumab Rash  . Metformin Other (See Comments)    GI upset  . Olmesartan Nausea And Vomiting  . Penicillins Rash    Has patient had a PCN reaction causing immediate rash, facial/tongue/throat swelling, SOB or lightheadedness with hypotension: Unknown Has patient had a PCN reaction causing severe rash involving mucus membranes or skin necrosis: Unknown Has patient had a PCN reaction that required hospitalization: Unknown Has patient had a PCN reaction  occurring within the last 10 years: No If all of the above answers are "NO", then may proceed with Cephalosporin use.     Medications Prior to Admission  Medication Sig Dispense Refill  . Abatacept (ORENCIA) 125 MG/ML SOSY Inject 125 mg into the skin every 7 (seven) days. Saturdays    . amitriptyline (ELAVIL) 50 MG tablet Take 50 mg by mouth at bedtime.      . Cholecalciferol (HM VITAMIN D3) 4000 UNITS CAPS Take 4,000 Units by mouth daily.      . cycloSPORINE (RESTASIS) 0.05 % ophthalmic emulsion Place 1 drop into both eyes 2 (two) times daily.     . Dulaglutide (TRULICITY) 1.82 XH/3.7JI SOPN Inject 0.75 mg into the skin every 7 (seven) days. Sunday    .  estradiol (ESTRACE) 0.5 MG tablet Take 0.5 mg by mouth daily.      Marland Kitchen ibuprofen (ADVIL,MOTRIN) 200 MG tablet Take 400 mg by mouth every 8 (eight) hours as needed for mild pain.    . Insulin Detemir (LEVEMIR FLEXPEN) 100 UNIT/ML Pen Inject 30 Units into the skin daily before breakfast.    . lansoprazole (PREVACID) 30 MG capsule Take 30 mg by mouth 2 (two) times daily before a meal.    . levothyroxine (SYNTHROID, LEVOTHROID) 112 MCG tablet Take 112 mcg by mouth daily before breakfast.     . methotrexate (RHEUMATREX) 2.5 MG tablet Take 17.5 mg by mouth every 7 (seven) days. Monday    . Multiple Vitamins-Minerals (ADULT GUMMY PO) Take 1 each by mouth daily. Women's    . nadolol (CORGARD) 80 MG tablet Take 120 mg by mouth every evening. Takes 1.5 tablet    . pravastatin (PRAVACHOL) 40 MG tablet Take 60 mg by mouth at bedtime. Takes 1.5 tablet    . aspirin-acetaminophen-caffeine (EXCEDRIN MIGRAINE) 250-250-65 MG tablet Take 2 tablets by mouth every 6 (six) hours as needed for headache.    . folic acid (FOLVITE) 967 MCG tablet Take 1,600 mcg by mouth daily.    . methylPREDNISolone (MEDROL DOSEPAK) 4 MG TBPK tablet Take by mouth as needed.      Results for orders placed or performed during the hospital encounter of 05/17/17 (from the past 48 hour(s))  Glucose, capillary     Status: Abnormal   Collection Time: 05/17/17 10:13 AM  Result Value Ref Range   Glucose-Capillary 100 (H) 65 - 99 mg/dL  Glucose, capillary     Status: None   Collection Time: 05/17/17 12:27 PM  Result Value Ref Range   Glucose-Capillary 93 65 - 99 mg/dL   No results found.  Pertinent items noted in HPI and remainder of comprehensive ROS otherwise negative.  Blood pressure (!) 197/86, pulse 61, temperature 97.6 F (36.4 C), temperature source Oral, resp. rate 20, SpO2 99 %.  Patient is awake and alert. She is oriented and appropriate. Speech is fluent. Judgment and insight are intact. Cranial nerve function normal  bilaterally. Motor examination with some slight weakness of her left deltoid muscle group and left by sedimentation rate muscle group otherwise motor strength intact. Sensory examination was decreased sensation to pinprick in her left C5 dermatome. Deep tendon reflexes normal active. No if's along track signs. Gait and posture recently normal. Exam is head ears eyes and throat are marked. Chest and abdomen are benign. Extremities are free from injury deformity. Assessment/Plan C4-5, C5-6 spondylosis with stenosis. Plan C4-5, C5-6 anterior cervical discectomy with interbody fusion utilizing interbody peek cages, locally harvested autograft, and anterior plate instrumentation.  Risks and benefits of been explained. Patient wishes to proceed.  , A 05/17/2017, 12:38 PM

## 2017-05-17 NOTE — Progress Notes (Signed)
Orthopedic Tech Progress Note Patient Details:  Sara Diaz Jan 07, 1946 [id number] Patient has c-collar. Patient ID: Sara Diaz, female   DOB: 02-Mar-1946, 71 y.o.   MRN: [id number]   [name] 05/17/2017, 6:22 PM

## 2017-05-17 NOTE — Op Note (Signed)
Date of procedure: 05/17/2017  Date of dictation: Same  Service: Neurosurgery  Preoperative diagnosis: C4-5, C5-6 spondylosis with stenosis  Postoperative diagnosis: Same  Procedure Name: C4-5, C5-6 anterior cervical discectomy with interbody fusion utilizing interbody peek cages, locally harvested autograft, and anterior plate instrumentation  Surgeon: A., M.D.  Asst. Surgeon: Vertell Limber  Anesthesia: General  Indication: 71 year old female with progressive neck and upper extremity pain left greater than right. Workup demonstrates evidence of marked cervical spondylosis with stenosis causing significant compression upon the spinal cord and severe compression the left C5 nerve root. Patient presents now for two-level anterior cervical decompression infusion in hopes of improving her symptoms.  Operative note: After induction of anesthesia, patient position supine with neck slightly extended held place of halter traction. Anterior cervical region prepped and draped sterilely. Incision made overlying C5. Dissection performed on the right. Retractor placed. Fluoroscopy used. Levels confirmed. Disc spaces incised at both levels. Discectomy performed with various instruments down to level posterior annulus. Microscope front field use at the remainder of the discectomy. Remaining aspects of annulus and osteophytes removed using high-speed drill down to level posterior longitudinal ligament. Posterior longitudinal limb is an elevated and resected in piecemeal fashion. Underlying thecal sac was then identified. Wide central decompression performed by undercutting the bodies of C4 and C5. Decompression then proceeded into each neural foramen. Wide anterior foraminotomies were performed on course exiting C5 nerve roots bilaterally. At this point a very thorough decompression been achieved. There was no evidence of injury to thecal sac and nerve roots. Wound is then irrigated MI solution. Procedure then  repeated at C5-6 again without complications. Gelfoam placed topically for hemostasis and removed. [employer] anatomic peek cages were then packed with locally harvested autograft. Each cage was then impacted and recessed slightly from the anterior cortical margin at C4-5 and C5-6. Medtronic anterior Atlantis cervical plate was then placed over the C4, C5 and C6 levels. This an attachment or fluoroscopic guidance using 13 mm variable-angle screws 2 each at all 3 levels. All screws given a final tightening found be solidly within bone. Locking screws and gauge at all 3 levels. Final images revealed good position of the cages proper upper level with normal lamina spine. Wounds and irrigated one final time. Hemostasis is assured with bipolar chart was and close in layers with Vicryl sutures. Steri-Strips and sterile dressing were applied. No.Apparent complications Patient tolerated the procedure well and she returns to the recovery room postop.

## 2017-05-17 NOTE — Anesthesia Preprocedure Evaluation (Addendum)
Anesthesia Evaluation   Patient awake    Reviewed: Allergy & Precautions, NPO status , Patient's Chart, lab work & pertinent test results, reviewed documented beta blocker date and time   Airway Mallampati: III  TM Distance: >3 FB Neck ROM: Limited    Dental no notable dental hx. (+) Dental Advisory Given, Partial Lower, Partial Upper,    Pulmonary PE Hx PE during pregnancy approx 40 yr ago. No issues since, not on blood thinners since   Pulmonary exam normal breath sounds clear to auscultation       Cardiovascular Exercise Tolerance: Good hypertension, Normal cardiovascular exam Rhythm:Regular Rate:Normal     Neuro/Psych  Headaches, negative psych ROS   GI/Hepatic Neg liver ROS, GERD  Controlled,Barrett's Esophagus   Endo/Other  diabetesHypothyroidism   Renal/GU Hx nephrolithiasis  negative genitourinary   Musculoskeletal  (+) Arthritis , Rheumatoid disorders,    Abdominal   Peds  Hematology negative hematology ROS (+)   Anesthesia Other Findings   Reproductive/Obstetrics negative OB ROS                          Anesthesia Physical Anesthesia Plan  ASA: II  Anesthesia Plan: General   Post-op Pain Management:    Induction: Intravenous  PONV Risk Score and Plan: 2 and Ondansetron, Dexamethasone and Treatment may vary due to age or medical condition  Airway Management Planned: Oral ETT  Additional Equipment:   Intra-op Plan:   Post-operative Plan: Extubation in OR  Informed Consent: I have reviewed the patients History and Physical, chart, labs and discussed the procedure including the risks, benefits and alternatives for the proposed anesthesia with the patient or authorized representative who has indicated his/her understanding and acceptance.   Dental advisory given  Plan Discussed with: CRNA  Anesthesia Plan Comments:        Anesthesia Quick Evaluation

## 2017-05-17 NOTE — Transfer of Care (Signed)
Immediate Anesthesia Transfer of Care Note  Patient: RHILYN BATTLE  Procedure(s) Performed: Procedure(s): Anterior Cervical Decompression/Discectomy Fusion - Cervical four-Cervical five - Cervical five-Cervical six (N/A)  Patient Location: PACU  Anesthesia Type:General  Level of Consciousness: awake and alert   Airway & Oxygen Therapy: Patient Spontanous Breathing and Patient connected to nasal cannula oxygen  Post-op Assessment: Report given to RN, Post -op Vital signs reviewed and stable and Patient moving all extremities X 4  Post vital signs: Reviewed and stable  Last Vitals:  Vitals:   05/17/17 1027  BP: (!) 197/86  Pulse: 61  Resp: 20  Temp: 36.4 C    Last Pain:  Vitals:   05/17/17 1027  TempSrc: Oral      Patients Stated Pain Goal: 5 (41/58/30 9407)  Complications: No apparent anesthesia complications

## 2017-05-18 ENCOUNTER — Encounter (HOSPITAL_COMMUNITY): Payer: Self-pay | Admitting: Neurosurgery

## 2017-05-18 LAB — GLUCOSE, CAPILLARY
GLUCOSE-CAPILLARY: 259 mg/dL — AB (ref 65–99)
Glucose-Capillary: 216 mg/dL — ABNORMAL HIGH (ref 65–99)

## 2017-05-18 MED ORDER — CYCLOBENZAPRINE HCL 10 MG PO TABS: 10.0000 mg | ORAL_TABLET | Freq: Three times a day (TID) | ORAL | 0 refills | Status: DC | PRN

## 2017-05-18 MED ORDER — TRAMADOL HCL 50 MG PO TABS: 50.0000 mg | ORAL_TABLET | Freq: Four times a day (QID) | ORAL | 0 refills | Status: DC | PRN

## 2017-05-18 MED ORDER — VITAMIN D 1000 UNITS PO TABS
4000.0000 [IU] | ORAL_TABLET | Freq: Every day | ORAL | Status: DC
  Administered 2017-05-18: 4000 [IU] via ORAL
  Filled 2017-05-18: qty 4

## 2017-05-18 NOTE — Discharge Summary (Signed)
Physician Discharge Summary  Patient ID: Sara Diaz MRN: [id number] DOB/AGE: 72-17-1947 72 y.o.  Admit date: 05/17/2017 Discharge date: 05/18/2017  Admission Diagnoses:  Discharge Diagnoses:  Active Problems:   Cervical spinal stenosis   Discharged Condition: good  Hospital Course: Patient admitted to the hospital where she underwent an uncomplicated two-level anterior cervical decompression and fusion. Postoperatively doing well. Preoperative neck and upper extremity symptoms improved. Swallowing well. Voice strong. Mobilizing without difficulty.  Consults:   Significant Diagnostic Studies:   Treatments:   Discharge Exam: Blood pressure (!) 120/50, pulse 70, temperature 98 F (36.7 C), temperature source Oral, resp. rate 16, SpO2 95 %. Awake and alert. Oriented and appropriate. Cranial nerve function intact. Motor and sensory function extremities normal. Wound clean and dry. Chest and abdomen benign.  Disposition: 01-Home or Self Care   Allergies as of 05/18/2017      Reactions   Etanercept Other (See Comments)   Excessive sweating   Folic Acid Itching   Other Other (See Comments), Swelling   Seizures   Sulfamethoxazole-trimethoprim Itching   Lisinopril Other (See Comments)   Nitrofurantoin Rash   'has red dye in it' 'has red dye in it'   Nitrofurantoin Macrocrystal Other (See Comments)   Canagliflozin Other (See Comments)   Constant yeast infection   Contrast Media [iodinated Diagnostic Agents] Other (See Comments)   Convulsions   Fluoxetine Other (See Comments)   Depression   Ioxaglate Other (See Comments)   Convulsions   Nabumetone Other (See Comments)   Red Dye Other (See Comments)   unknown   Adalimumab Rash   Metformin Other (See Comments)   GI upset   Olmesartan Nausea And Vomiting   Penicillins Rash   Has patient had a PCN reaction causing immediate rash, facial/tongue/throat swelling, SOB or lightheadedness with hypotension: Unknown Has patient  had a PCN reaction causing severe rash involving mucus membranes or skin necrosis: Unknown Has patient had a PCN reaction that required hospitalization: Unknown Has patient had a PCN reaction occurring within the last 10 years: No If all of the above answers are "NO", then may proceed with Cephalosporin use.      Medication List    TAKE these medications   ADULT GUMMY PO Take 1 each by mouth daily. Women's   amitriptyline 50 MG tablet Commonly known as:  ELAVIL Take 50 mg by mouth at bedtime.   aspirin-acetaminophen-caffeine 250-250-65 MG tablet Commonly known as:  EXCEDRIN MIGRAINE Take 2 tablets by mouth every 6 (six) hours as needed for headache.   cyclobenzaprine 10 MG tablet Commonly known as:  FLEXERIL Take 1 tablet (10 mg total) by mouth 3 (three) times daily as needed for muscle spasms.   cycloSPORINE 0.05 % ophthalmic emulsion Commonly known as:  RESTASIS Place 1 drop into both eyes 2 (two) times daily.   estradiol 0.5 MG tablet Commonly known as:  ESTRACE Take 0.5 mg by mouth daily.   folic acid 578 MCG tablet Commonly known as:  FOLVITE Take 1,600 mcg by mouth daily.   HM VITAMIN D3 4000 units Caps Generic drug:  Cholecalciferol Take 4,000 Units by mouth daily.   ibuprofen 200 MG tablet Commonly known as:  ADVIL,MOTRIN Take 400 mg by mouth every 8 (eight) hours as needed for mild pain.   lansoprazole 30 MG capsule Commonly known as:  PREVACID Take 30 mg by mouth 2 (two) times daily before a meal.   LEVEMIR FLEXPEN 100 UNIT/ML Pen Generic drug:  Insulin Detemir Inject 30 Units  into the skin daily before breakfast.   levothyroxine 112 MCG tablet Commonly known as:  SYNTHROID, LEVOTHROID Take 112 mcg by mouth daily before breakfast.   methotrexate 2.5 MG tablet Commonly known as:  RHEUMATREX Take 17.5 mg by mouth every 7 (seven) days. Monday   methylPREDNISolone 4 MG Tbpk tablet Commonly known as:  MEDROL DOSEPAK Take by mouth as needed.    nadolol 80 MG tablet Commonly known as:  CORGARD Take 120 mg by mouth every evening. Takes 1.5 tablet   ORENCIA 125 MG/ML Sosy Generic drug:  Abatacept Inject 125 mg into the skin every 7 (seven) days. Saturdays   pravastatin 40 MG tablet Commonly known as:  PRAVACHOL Take 60 mg by mouth at bedtime. Takes 1.5 tablet   traMADol 50 MG tablet Commonly known as:  ULTRAM Take 1-2 tablets (50-100 mg total) by mouth every 6 (six) hours as needed.   TRULICITY 1.11 BZ/2.0EY Sopn Generic drug:  Dulaglutide Inject 0.75 mg into the skin every 7 (seven) days. Sunday        Signed: , A 05/18/2017, 7:53 AM

## 2017-05-18 NOTE — Progress Notes (Signed)
D/c instruction reviewed with patient and spouse

## 2017-05-18 NOTE — Care Management Note (Signed)
Case Management Note  Patient Details  Name: ALISIA VANENGEN MRN: [id number] Date of Birth: 03-12-1946  Subjective/Objective:                    Action/Plan: Pt discharging home with self care. Pt has PCP, insurance and transportation home. No further needs per CM.   Expected Discharge Date:  05/18/17               Expected Discharge Plan:  Home/Self Care  In-House Referral:     Discharge planning Services     Post Acute Care Choice:    Choice offered to:     DME Arranged:    DME Agency:     HH Arranged:    HH Agency:     Status of Service:  Completed, signed off  If discussed at [employer] of Stay Meetings, dates discussed:    Additional Comments:  Pollie Friar, RN 05/18/2017, 11:55 AM

## 2017-05-18 NOTE — Discharge Instructions (Signed)

## 2017-05-25 DIAGNOSIS — M810 Age-related osteoporosis without current pathological fracture: Secondary | ICD-10-CM | POA: Diagnosis not present

## 2017-05-25 DIAGNOSIS — M4802 Spinal stenosis, cervical region: Secondary | ICD-10-CM | POA: Diagnosis not present

## 2017-05-30 ENCOUNTER — Other Ambulatory Visit: Payer: Self-pay | Admitting: Neurosurgery

## 2017-05-30 DIAGNOSIS — M4802 Spinal stenosis, cervical region: Secondary | ICD-10-CM

## 2017-05-31 ENCOUNTER — Encounter (HOSPITAL_COMMUNITY): Payer: Self-pay | Admitting: *Deleted

## 2017-05-31 ENCOUNTER — Other Ambulatory Visit: Payer: Self-pay | Admitting: Neurosurgery

## 2017-05-31 ENCOUNTER — Ambulatory Visit
Admission: RE | Admit: 2017-05-31 | Discharge: 2017-05-31 | Disposition: A | Discharge status: Home / Self Care | Payer: PPO | Source: Ambulatory Visit | Attending: Neurosurgery | Admitting: Neurosurgery

## 2017-05-31 DIAGNOSIS — M50223 Other cervical disc displacement at C6-C7 level: Secondary | ICD-10-CM | POA: Diagnosis not present

## 2017-05-31 DIAGNOSIS — M4802 Spinal stenosis, cervical region: Secondary | ICD-10-CM

## 2017-05-31 DIAGNOSIS — M50323 Other cervical disc degeneration at C6-C7 level: Secondary | ICD-10-CM | POA: Diagnosis not present

## 2017-05-31 MED ORDER — MUPIROCIN 2 % EX OINT: 1.0000 "application " | TOPICAL_OINTMENT | Freq: Once | CUTANEOUS | Status: DC

## 2017-06-01 ENCOUNTER — Other Ambulatory Visit: Payer: BC Managed Care – PPO

## 2017-06-01 ENCOUNTER — Inpatient Hospital Stay (HOSPITAL_COMMUNITY): Payer: PPO | Admitting: Anesthesiology

## 2017-06-01 ENCOUNTER — Encounter (HOSPITAL_COMMUNITY)
Admission: RE | Disposition: A | Discharge status: Home / Self Care | Payer: Self-pay | Source: Ambulatory Visit | Attending: Neurosurgery

## 2017-06-01 ENCOUNTER — Inpatient Hospital Stay (HOSPITAL_COMMUNITY): Payer: PPO

## 2017-06-01 ENCOUNTER — Inpatient Hospital Stay (HOSPITAL_COMMUNITY)
Admission: RE | Admit: 2017-06-01 | Discharge: 2017-06-02 | DRG: 473 | Disposition: A | Discharge status: Home / Self Care | Payer: PPO | Source: Ambulatory Visit | Attending: Neurosurgery | Admitting: Neurosurgery

## 2017-06-01 DIAGNOSIS — Z87442 Personal history of urinary calculi: Secondary | ICD-10-CM | POA: Diagnosis not present

## 2017-06-01 DIAGNOSIS — M810 Age-related osteoporosis without current pathological fracture: Secondary | ICD-10-CM | POA: Diagnosis not present

## 2017-06-01 DIAGNOSIS — Z86718 Personal history of other venous thrombosis and embolism: Secondary | ICD-10-CM

## 2017-06-01 DIAGNOSIS — Z888 Allergy status to other drugs, medicaments and biological substances status: Secondary | ICD-10-CM | POA: Diagnosis not present

## 2017-06-01 DIAGNOSIS — Z882 Allergy status to sulfonamides status: Secondary | ICD-10-CM

## 2017-06-01 DIAGNOSIS — Z7982 Long term (current) use of aspirin: Secondary | ICD-10-CM | POA: Diagnosis not present

## 2017-06-01 DIAGNOSIS — S12500A Unspecified displaced fracture of sixth cervical vertebra, initial encounter for closed fracture: Secondary | ICD-10-CM | POA: Diagnosis not present

## 2017-06-01 DIAGNOSIS — M4802 Spinal stenosis, cervical region: Secondary | ICD-10-CM | POA: Diagnosis not present

## 2017-06-01 DIAGNOSIS — Z880 Allergy status to penicillin: Secondary | ICD-10-CM

## 2017-06-01 DIAGNOSIS — E119 Type 2 diabetes mellitus without complications: Secondary | ICD-10-CM | POA: Diagnosis present

## 2017-06-01 DIAGNOSIS — Z419 Encounter for procedure for purposes other than remedying health state, unspecified: Secondary | ICD-10-CM

## 2017-06-01 DIAGNOSIS — Z79899 Other long term (current) drug therapy: Secondary | ICD-10-CM

## 2017-06-01 DIAGNOSIS — Z794 Long term (current) use of insulin: Secondary | ICD-10-CM | POA: Diagnosis not present

## 2017-06-01 DIAGNOSIS — M1990 Unspecified osteoarthritis, unspecified site: Secondary | ICD-10-CM | POA: Diagnosis not present

## 2017-06-01 DIAGNOSIS — Y838 Other surgical procedures as the cause of abnormal reaction of the patient, or of later complication, without mention of misadventure at the time of the procedure: Secondary | ICD-10-CM | POA: Diagnosis present

## 2017-06-01 DIAGNOSIS — Z981 Arthrodesis status: Secondary | ICD-10-CM | POA: Diagnosis not present

## 2017-06-01 DIAGNOSIS — I10 Essential (primary) hypertension: Secondary | ICD-10-CM | POA: Diagnosis not present

## 2017-06-01 DIAGNOSIS — M4322 Fusion of spine, cervical region: Secondary | ICD-10-CM | POA: Diagnosis not present

## 2017-06-01 DIAGNOSIS — M069 Rheumatoid arthritis, unspecified: Secondary | ICD-10-CM | POA: Diagnosis not present

## 2017-06-01 DIAGNOSIS — Z7952 Long term (current) use of systemic steroids: Secondary | ICD-10-CM | POA: Diagnosis not present

## 2017-06-01 DIAGNOSIS — Z91041 Radiographic dye allergy status: Secondary | ICD-10-CM

## 2017-06-01 DIAGNOSIS — Z86711 Personal history of pulmonary embolism: Secondary | ICD-10-CM

## 2017-06-01 DIAGNOSIS — S12400A Unspecified displaced fracture of fifth cervical vertebra, initial encounter for closed fracture: Principal | ICD-10-CM | POA: Diagnosis present

## 2017-06-01 DIAGNOSIS — Z472 Encounter for removal of internal fixation device: Secondary | ICD-10-CM | POA: Diagnosis not present

## 2017-06-01 DIAGNOSIS — K219 Gastro-esophageal reflux disease without esophagitis: Secondary | ICD-10-CM | POA: Diagnosis not present

## 2017-06-01 DIAGNOSIS — M79601 Pain in right arm: Secondary | ICD-10-CM | POA: Diagnosis present

## 2017-06-01 DIAGNOSIS — M5412 Radiculopathy, cervical region: Secondary | ICD-10-CM | POA: Diagnosis not present

## 2017-06-01 HISTORY — DX: Dyspnea, unspecified: R06.00

## 2017-06-01 HISTORY — DX: Nausea with vomiting, unspecified: R11.2

## 2017-06-01 HISTORY — PX: ANTERIOR CERVICAL DECOMP/DISCECTOMY FUSION: SHX1161

## 2017-06-01 HISTORY — DX: Other specified postprocedural states: Z98.890

## 2017-06-01 HISTORY — DX: Other complications of anesthesia, initial encounter: T88.59XA

## 2017-06-01 HISTORY — DX: Adverse effect of unspecified anesthetic, initial encounter: T41.45XA

## 2017-06-01 LAB — GLUCOSE, CAPILLARY
GLUCOSE-CAPILLARY: 157 mg/dL — AB (ref 65–99)
GLUCOSE-CAPILLARY: 342 mg/dL — AB (ref 65–99)
GLUCOSE-CAPILLARY: 255 mg/dL — AB (ref 65–99)
Glucose-Capillary: 145 mg/dL — ABNORMAL HIGH (ref 65–99)

## 2017-06-01 LAB — BASIC METABOLIC PANEL
Anion gap: 6 (ref 5–15)
BUN: 11 mg/dL (ref 6–20)
CO2: 28 mmol/L (ref 22–32)
Calcium: 8.9 mg/dL (ref 8.9–10.3)
Chloride: 103 mmol/L (ref 101–111)
Creatinine, Ser: 0.77 mg/dL (ref 0.44–1.00)
GFR calc Af Amer: >60 mL/min (ref >60)
GLUCOSE: 149 mg/dL — AB (ref 65–99)
POTASSIUM: 4.3 mmol/L (ref 3.5–5.1)
SODIUM: 137 mmol/L (ref 135–145)

## 2017-06-01 LAB — CBC
HEMATOCRIT: 38.4 % (ref 36.0–46.0)
HEMOGLOBIN: 13.1 g/dL (ref 12.0–15.0)
MCH: 30.7 pg (ref 26.0–34.0)
MCHC: 34.1 g/dL (ref 30.0–36.0)
MCV: 89.9 fL (ref 78.0–100.0)
Platelets: 203 10*3/uL (ref 150–400)
RBC: 4.27 MIL/uL (ref 3.87–5.11)
RDW: 13.6 % (ref 11.5–15.5)
WBC: 7.3 10*3/uL (ref 4.0–10.5)

## 2017-06-01 SURGERY — ANTERIOR CERVICAL DECOMPRESSION/DISCECTOMY FUSION 1 LEVEL/HARDWARE REMOVAL
Anesthesia: General | Site: Neck

## 2017-06-01 MED ORDER — ONDANSETRON HCL 4 MG/2ML IJ SOLN: 4.0000 mg | Freq: Four times a day (QID) | INTRAMUSCULAR | Status: DC | PRN

## 2017-06-01 MED ORDER — HYDROMORPHONE HCL 1 MG/ML IJ SOLN: 0.2500 mg | INTRAMUSCULAR | Status: DC | PRN

## 2017-06-01 MED ORDER — CHLORHEXIDINE GLUCONATE CLOTH 2 % EX PADS: 6.0000 | MEDICATED_PAD | Freq: Once | CUTANEOUS | Status: DC

## 2017-06-01 MED ORDER — FOLIC ACID 800 MCG PO TABS: 1600.0000 ug | ORAL_TABLET | Freq: Every day | ORAL | Status: DC

## 2017-06-01 MED ORDER — SUCCINYLCHOLINE CHLORIDE 200 MG/10ML IV SOSY
PREFILLED_SYRINGE | INTRAVENOUS | Status: AC
  Filled 2017-06-01: qty 10

## 2017-06-01 MED ORDER — ONDANSETRON HCL 4 MG/2ML IJ SOLN
INTRAMUSCULAR | Status: DC | PRN
  Administered 2017-06-01 (×2): 4 mg via INTRAVENOUS

## 2017-06-01 MED ORDER — HYDROCODONE-ACETAMINOPHEN 5-325 MG PO TABS
1.0000 | ORAL_TABLET | ORAL | Status: DC | PRN
  Administered 2017-06-01: 2 via ORAL
  Filled 2017-06-01: qty 2

## 2017-06-01 MED ORDER — PHENOL 1.4 % MT LIQD: 1.0000 | OROMUCOSAL | Status: DC | PRN

## 2017-06-01 MED ORDER — PROPOFOL 10 MG/ML IV BOLUS
INTRAVENOUS | Status: AC
  Filled 2017-06-01: qty 20

## 2017-06-01 MED ORDER — SUGAMMADEX SODIUM 200 MG/2ML IV SOLN
INTRAVENOUS | Status: DC | PRN
  Administered 2017-06-01: 150 mg via INTRAVENOUS

## 2017-06-01 MED ORDER — LIDOCAINE 2% (20 MG/ML) 5 ML SYRINGE
INTRAMUSCULAR | Status: AC
  Filled 2017-06-01: qty 5

## 2017-06-01 MED ORDER — FENTANYL CITRATE (PF) 100 MCG/2ML IJ SOLN
INTRAMUSCULAR | Status: DC | PRN
  Administered 2017-06-01: 50 ug via INTRAVENOUS
  Administered 2017-06-01: 100 ug via INTRAVENOUS

## 2017-06-01 MED ORDER — LIDOCAINE HCL (CARDIAC) 20 MG/ML IV SOLN
INTRAVENOUS | Status: DC | PRN
  Administered 2017-06-01: 80 mg via INTRAVENOUS

## 2017-06-01 MED ORDER — EPHEDRINE 5 MG/ML INJ
INTRAVENOUS | Status: AC
  Filled 2017-06-01: qty 10

## 2017-06-01 MED ORDER — LACTATED RINGERS IV SOLN
INTRAVENOUS | Status: DC
  Administered 2017-06-01 (×3): via INTRAVENOUS

## 2017-06-01 MED ORDER — LEVOTHYROXINE SODIUM 112 MCG PO TABS
112.0000 ug | ORAL_TABLET | Freq: Every day | ORAL | Status: DC
Start: 2017-06-01 — End: 2017-06-02
  Administered 2017-06-01: 112 ug via ORAL
  Filled 2017-06-01: qty 1

## 2017-06-01 MED ORDER — VANCOMYCIN HCL IN DEXTROSE 1-5 GM/200ML-% IV SOLN
1000.0000 mg | INTRAVENOUS | Status: AC
  Administered 2017-06-01: 1000 mg via INTRAVENOUS
  Filled 2017-06-01: qty 200

## 2017-06-01 MED ORDER — INSULIN ASPART 100 UNIT/ML ~~LOC~~ SOLN
0.0000 [IU] | Freq: Every day | SUBCUTANEOUS | Status: DC
  Administered 2017-06-01: 3 [IU] via SUBCUTANEOUS

## 2017-06-01 MED ORDER — ROCURONIUM BROMIDE 100 MG/10ML IV SOLN
INTRAVENOUS | Status: DC | PRN
  Administered 2017-06-01: 40 mg via INTRAVENOUS

## 2017-06-01 MED ORDER — AMITRIPTYLINE HCL 50 MG PO TABS
50.0000 mg | ORAL_TABLET | Freq: Every day | ORAL | Status: DC
  Administered 2017-06-01: 50 mg via ORAL
  Filled 2017-06-01: qty 1

## 2017-06-01 MED ORDER — PROPOFOL 500 MG/50ML IV EMUL
INTRAVENOUS | Status: DC | PRN
  Administered 2017-06-01: 50 ug/kg/min via INTRAVENOUS

## 2017-06-01 MED ORDER — MIDAZOLAM HCL 5 MG/5ML IJ SOLN
INTRAMUSCULAR | Status: DC | PRN
  Administered 2017-06-01: 1 mg via INTRAVENOUS

## 2017-06-01 MED ORDER — THROMBIN 5000 UNITS EX SOLR
CUTANEOUS | Status: DC | PRN
Start: 2017-06-01 — End: 2017-06-01
  Administered 2017-06-01 (×2): 5000 [IU] via TOPICAL

## 2017-06-01 MED ORDER — 0.9 % SODIUM CHLORIDE (POUR BTL) OPTIME
TOPICAL | Status: DC | PRN
  Administered 2017-06-01: 1000 mL

## 2017-06-01 MED ORDER — INSULIN DETEMIR 100 UNIT/ML ~~LOC~~ SOLN
30.0000 [IU] | Freq: Every day | SUBCUTANEOUS | Status: DC
  Administered 2017-06-02: 30 [IU] via SUBCUTANEOUS
  Filled 2017-06-01: qty 0.3

## 2017-06-01 MED ORDER — FENTANYL CITRATE (PF) 250 MCG/5ML IJ SOLN
INTRAMUSCULAR | Status: AC
  Filled 2017-06-01: qty 5

## 2017-06-01 MED ORDER — ONDANSETRON HCL 4 MG/2ML IJ SOLN
INTRAMUSCULAR | Status: AC
  Filled 2017-06-01: qty 2

## 2017-06-01 MED ORDER — SUGAMMADEX SODIUM 200 MG/2ML IV SOLN
INTRAVENOUS | Status: AC
  Filled 2017-06-01: qty 2

## 2017-06-01 MED ORDER — INSULIN ASPART 100 UNIT/ML ~~LOC~~ SOLN: 0.0000 [IU] | Freq: Three times a day (TID) | SUBCUTANEOUS | Status: DC

## 2017-06-01 MED ORDER — ESTRADIOL 1 MG PO TABS
0.5000 mg | ORAL_TABLET | Freq: Every day | ORAL | Status: DC
  Filled 2017-06-01: qty 0.5

## 2017-06-01 MED ORDER — OXYCODONE HCL 5 MG/5ML PO SOLN: 5.0000 mg | Freq: Once | ORAL | Status: DC | PRN

## 2017-06-01 MED ORDER — PROPOFOL 10 MG/ML IV BOLUS
INTRAVENOUS | Status: DC | PRN
  Administered 2017-06-01: 140 mg via INTRAVENOUS

## 2017-06-01 MED ORDER — ONDANSETRON HCL 4 MG PO TABS: 4.0000 mg | ORAL_TABLET | Freq: Four times a day (QID) | ORAL | Status: DC | PRN

## 2017-06-01 MED ORDER — SODIUM CHLORIDE 0.9 % IR SOLN
Status: DC | PRN
  Administered 2017-06-01: 500 mL

## 2017-06-01 MED ORDER — ROCURONIUM BROMIDE 10 MG/ML (PF) SYRINGE
PREFILLED_SYRINGE | INTRAVENOUS | Status: AC
  Filled 2017-06-01: qty 5

## 2017-06-01 MED ORDER — ASPIRIN-ACETAMINOPHEN-CAFFEINE 250-250-65 MG PO TABS
2.0000 | ORAL_TABLET | Freq: Every day | ORAL | Status: DC | PRN
  Filled 2017-06-01 (×2): qty 2

## 2017-06-01 MED ORDER — SCOPOLAMINE 1 MG/3DAYS TD PT72
MEDICATED_PATCH | TRANSDERMAL | Status: DC | PRN
  Administered 2017-06-01: 1 via TRANSDERMAL

## 2017-06-01 MED ORDER — DULAGLUTIDE 0.75 MG/0.5ML ~~LOC~~ SOAJ: 0.7500 mg | SUBCUTANEOUS | Status: DC

## 2017-06-01 MED ORDER — CYCLOSPORINE 0.05 % OP EMUL
1.0000 [drp] | Freq: Two times a day (BID) | OPHTHALMIC | Status: DC
  Filled 2017-06-01 (×2): qty 1

## 2017-06-01 MED ORDER — ABATACEPT 125 MG/ML ~~LOC~~ SOSY
125.0000 mg | PREFILLED_SYRINGE | SUBCUTANEOUS | Status: DC
Start: 2017-06-01 — End: 2017-06-02

## 2017-06-01 MED ORDER — HYDROMORPHONE HCL 1 MG/ML IJ SOLN: 0.5000 mg | INTRAMUSCULAR | Status: DC | PRN

## 2017-06-01 MED ORDER — MIDAZOLAM HCL 2 MG/2ML IJ SOLN
INTRAMUSCULAR | Status: AC
  Filled 2017-06-01: qty 2

## 2017-06-01 MED ORDER — SCOPOLAMINE 1 MG/3DAYS TD PT72
MEDICATED_PATCH | TRANSDERMAL | Status: AC
  Filled 2017-06-01: qty 1

## 2017-06-01 MED ORDER — CYCLOBENZAPRINE HCL 10 MG PO TABS: 10.0000 mg | ORAL_TABLET | Freq: Three times a day (TID) | ORAL | Status: DC | PRN

## 2017-06-01 MED ORDER — HEMOSTATIC AGENTS (NO CHARGE) OPTIME
TOPICAL | Status: DC | PRN
  Administered 2017-06-01: 1 via TOPICAL

## 2017-06-01 MED ORDER — PHENYLEPHRINE 40 MCG/ML (10ML) SYRINGE FOR IV PUSH (FOR BLOOD PRESSURE SUPPORT)
PREFILLED_SYRINGE | INTRAVENOUS | Status: AC
  Filled 2017-06-01: qty 10

## 2017-06-01 MED ORDER — OXYCODONE HCL 5 MG PO TABS: 5.0000 mg | ORAL_TABLET | Freq: Once | ORAL | Status: DC | PRN

## 2017-06-01 MED ORDER — DEXAMETHASONE SODIUM PHOSPHATE 10 MG/ML IJ SOLN
10.0000 mg | INTRAMUSCULAR | Status: AC
  Administered 2017-06-01: 10 mg via INTRAVENOUS
  Filled 2017-06-01: qty 1

## 2017-06-01 MED ORDER — MENTHOL 3 MG MT LOZG: 1.0000 | LOZENGE | OROMUCOSAL | Status: DC | PRN

## 2017-06-01 MED ORDER — THROMBIN 5000 UNITS EX SOLR
CUTANEOUS | Status: AC
  Filled 2017-06-01: qty 15000

## 2017-06-01 MED ORDER — INSULIN ASPART 100 UNIT/ML ~~LOC~~ SOLN
0.0000 [IU] | Freq: Three times a day (TID) | SUBCUTANEOUS | Status: DC
  Administered 2017-06-01: 11 [IU] via SUBCUTANEOUS

## 2017-06-01 MED ORDER — CEFAZOLIN SODIUM-DEXTROSE 1-4 GM/50ML-% IV SOLN
1.0000 g | Freq: Three times a day (TID) | INTRAVENOUS | Status: AC
  Administered 2017-06-01 (×2): 1 g via INTRAVENOUS
  Filled 2017-06-01 (×2): qty 50

## 2017-06-01 MED ORDER — CITALOPRAM HYDROBROMIDE 20 MG PO TABS
20.0000 mg | ORAL_TABLET | Freq: Every day | ORAL | Status: DC
  Filled 2017-06-01 (×2): qty 1

## 2017-06-01 MED ORDER — VITAMIN D 1000 UNITS PO TABS
4000.0000 [IU] | ORAL_TABLET | Freq: Every day | ORAL | Status: DC
  Filled 2017-06-01: qty 4

## 2017-06-01 MED ORDER — SODIUM CHLORIDE 0.9% FLUSH: 3.0000 mL | Freq: Two times a day (BID) | INTRAVENOUS | Status: DC

## 2017-06-01 MED ORDER — FOLIC ACID 1 MG PO TABS: 1.5000 mg | ORAL_TABLET | Freq: Every day | ORAL | Status: DC

## 2017-06-01 MED ORDER — PRAVASTATIN SODIUM 40 MG PO TABS
60.0000 mg | ORAL_TABLET | Freq: Every day | ORAL | Status: DC
  Administered 2017-06-01: 60 mg via ORAL
  Filled 2017-06-01: qty 2

## 2017-06-01 MED ORDER — NADOLOL 80 MG PO TABS
120.0000 mg | ORAL_TABLET | Freq: Every evening | ORAL | Status: DC
  Administered 2017-06-01: 120 mg via ORAL
  Filled 2017-06-01: qty 1

## 2017-06-01 MED ORDER — PANTOPRAZOLE SODIUM 20 MG PO TBEC
20.0000 mg | DELAYED_RELEASE_TABLET | Freq: Every day | ORAL | Status: DC
  Administered 2017-06-01: 20 mg via ORAL
  Filled 2017-06-01: qty 1

## 2017-06-01 MED ORDER — SODIUM CHLORIDE 0.9% FLUSH: 3.0000 mL | INTRAVENOUS | Status: DC | PRN

## 2017-06-01 SURGICAL SUPPLY — 57 items
BAG DECANTER FOR FLEXI CONT (MISCELLANEOUS) ×2 IMPLANT
BENZOIN TINCTURE PRP APPL 2/3 (GAUZE/BANDAGES/DRESSINGS) ×2 IMPLANT
BIT DRILL NEURO 2X3.1 SFT TUCH (MISCELLANEOUS) ×1 IMPLANT
BUR MATCHSTICK NEURO 3.0 LAGG (BURR) ×2 IMPLANT
CAGE PEEK 8X14X11 (Cage) ×2 IMPLANT
CANISTER SUCT 3000ML PPV (MISCELLANEOUS) ×2 IMPLANT
CARTRIDGE OIL MAESTRO DRILL (MISCELLANEOUS) ×1 IMPLANT
DIFFUSER DRILL AIR PNEUMATIC (MISCELLANEOUS) ×2 IMPLANT
DRAPE C-ARM 35X43 STRL (DRAPES) ×4 IMPLANT
DRAPE LAPAROTOMY 100X72 PEDS (DRAPES) ×2 IMPLANT
DRAPE MICROSCOPE LEICA (MISCELLANEOUS) ×2 IMPLANT
DRAPE POUCH INSTRU U-SHP 10X18 (DRAPES) ×2 IMPLANT
DRILL NEURO 2X3.1 SOFT TOUCH (MISCELLANEOUS) ×2
DRSG OPSITE POSTOP 3X4 (GAUZE/BANDAGES/DRESSINGS) ×2 IMPLANT
DURAPREP 6ML APPLICATOR 50/CS (WOUND CARE) ×2 IMPLANT
ELECT COATED BLADE 2.86 ST (ELECTRODE) ×2 IMPLANT
ELECT REM PT RETURN 9FT ADLT (ELECTROSURGICAL) ×2
ELECTRODE REM PT RTRN 9FT ADLT (ELECTROSURGICAL) ×1 IMPLANT
GAUZE SPONGE 4X4 12PLY STRL (GAUZE/BANDAGES/DRESSINGS) ×2 IMPLANT
GAUZE SPONGE 4X4 16PLY XRAY LF (GAUZE/BANDAGES/DRESSINGS) IMPLANT
GLOVE BIOGEL PI IND STRL 7.0 (GLOVE) ×1 IMPLANT
GLOVE BIOGEL PI INDICATOR 7.0 (GLOVE) ×1
GLOVE ECLIPSE 7.5 STRL STRAW (GLOVE) ×2 IMPLANT
GLOVE ECLIPSE 9.0 STRL (GLOVE) ×4 IMPLANT
GLOVE EXAM NITRILE LRG STRL (GLOVE) IMPLANT
GLOVE EXAM NITRILE XL STR (GLOVE) IMPLANT
GLOVE EXAM NITRILE XS STR PU (GLOVE) IMPLANT
GLOVE INDICATOR 8.0 STRL GRN (GLOVE) ×4 IMPLANT
GOWN STRL REUS W/ TWL LRG LVL3 (GOWN DISPOSABLE) IMPLANT
GOWN STRL REUS W/ TWL XL LVL3 (GOWN DISPOSABLE) ×2 IMPLANT
GOWN STRL REUS W/TWL 2XL LVL3 (GOWN DISPOSABLE) ×2 IMPLANT
GOWN STRL REUS W/TWL LRG LVL3 (GOWN DISPOSABLE)
GOWN STRL REUS W/TWL XL LVL3 (GOWN DISPOSABLE) ×2
HALTER HD/CHIN CERV TRACTION D (MISCELLANEOUS) ×2 IMPLANT
KIT BASIN OR (CUSTOM PROCEDURE TRAY) ×2 IMPLANT
KIT ROOM TURNOVER OR (KITS) ×2 IMPLANT
NEEDLE SPNL 20GX3.5 QUINCKE YW (NEEDLE) ×2 IMPLANT
NS IRRIG 1000ML POUR BTL (IV SOLUTION) ×2 IMPLANT
OIL CARTRIDGE MAESTRO DRILL (MISCELLANEOUS) ×2
PACK LAMINECTOMY NEURO (CUSTOM PROCEDURE TRAY) ×2 IMPLANT
PAD ARMBOARD 7.5X6 YLW CONV (MISCELLANEOUS) ×6 IMPLANT
PLATE ELITE 42MM (Plate) ×2 IMPLANT
RUBBERBAND STERILE (MISCELLANEOUS) ×4 IMPLANT
SCREW ST 13X4.5XST VAR NS (Screw) ×4 IMPLANT
SCREW ST 13X4XST VA NS SPNE (Screw) ×4 IMPLANT
SCREW ST VAR 4 ATL (Screw) ×4 IMPLANT
SCREW ST VAR 4.5 ATL (Screw) ×4 IMPLANT
SPONGE INTESTINAL PEANUT (DISPOSABLE) IMPLANT
SPONGE SURGIFOAM ABS GEL SZ50 (HEMOSTASIS) ×2 IMPLANT
STRIP CLOSURE SKIN 1/2X4 (GAUZE/BANDAGES/DRESSINGS) ×2 IMPLANT
SUT VIC AB 3-0 SH 8-18 (SUTURE) ×2 IMPLANT
SUT VIC AB 4-0 RB1 18 (SUTURE) ×2 IMPLANT
TAPE CLOTH 4X10 WHT NS (GAUZE/BANDAGES/DRESSINGS) ×2 IMPLANT
TOWEL GREEN STERILE (TOWEL DISPOSABLE) ×2 IMPLANT
TOWEL GREEN STERILE FF (TOWEL DISPOSABLE) ×2 IMPLANT
TRAP SPECIMEN MUCOUS 40CC (MISCELLANEOUS) ×2 IMPLANT
WATER STERILE IRR 1000ML POUR (IV SOLUTION) ×2 IMPLANT

## 2017-06-01 NOTE — Anesthesia Procedure Notes (Signed)
Procedure Name: Intubation Date/Time: 06/01/2017 11:34 AM Performed by: Izora Gala Pre-anesthesia Checklist: Patient identified, Emergency Drugs available, Suction available and Patient being monitored Patient Re-evaluated:Patient Re-evaluated prior to induction Oxygen Delivery Method: Circle system utilized Induction Type: IV induction Ventilation: Mask ventilation without difficulty Laryngoscope Size: Glidescope (Glidescope intubation based on previous intubation note and recent neck surgery.) Grade View: Grade I Tube type: Oral Tube size: 7.5 mm Number of attempts: 1 Airway Equipment and Method: Stylet and Video-laryngoscopy Placement Confirmation: ETT inserted through vocal cords under direct vision,  positive ETCO2 and breath sounds checked- equal and bilateral Secured at: 20 cm Tube secured with: Tape Dental Injury: Teeth and Oropharynx as per pre-operative assessment

## 2017-06-01 NOTE — Anesthesia Preprocedure Evaluation (Addendum)
Anesthesia Evaluation  Patient identified by MRN, date of birth, ID band Patient awake    Reviewed: Allergy & Precautions, NPO status , Patient's Chart, lab work & pertinent test results  History of Anesthesia Complications (+) PONV and history of anesthetic complications  Airway Mallampati: III  TM Distance: <3 FB Neck ROM: Full    Dental  (+) Teeth Intact   Pulmonary shortness of breath,    breath sounds clear to auscultation       Cardiovascular hypertension,  Rhythm:Regular Rate:Normal     Neuro/Psych  Neuromuscular disease    GI/Hepatic GERD  ,  Endo/Other  diabetesHypothyroidism   Renal/GU      Musculoskeletal  (+) Arthritis ,   Abdominal   Peds  Hematology   Anesthesia Other Findings   Reproductive/Obstetrics                            Anesthesia Physical Anesthesia Plan  ASA: II  Anesthesia Plan: General   Post-op Pain Management:    Induction: Intravenous  PONV Risk Score and Plan: 4 or greater and Ondansetron, Dexamethasone, Midazolam, Scopolamine patch - Pre-op, Propofol infusion and Treatment may vary due to age or medical condition  Airway Management Planned: Oral ETT  Additional Equipment:   Intra-op Plan:   Post-operative Plan: Extubation in OR  Informed Consent: I have reviewed the patients History and Physical, chart, labs and discussed the procedure including the risks, benefits and alternatives for the proposed anesthesia with the patient or authorized representative who has indicated his/her understanding and acceptance.   Dental advisory given  Plan Discussed with: CRNA  Anesthesia Plan Comments:        Anesthesia Quick Evaluation

## 2017-06-01 NOTE — Op Note (Signed)
Date of procedure: 06/01/2017  Date of dictation: Same  Service: Neurosurgery  Preoperative diagnosis: Status post C4-5 and C5-6 anterior cervical decompression and fusion with new C5-6 fracture with retropulsed bone and radiculopathy  Postoperative diagnosis: Same  Procedure Name: Reexploration of C4-5-6 anterior cervical fusion. Removal of hardware and cage.  Revision anterior cervical decompression C5-6  Revision anterior cervical fusion C4-5-6 with interbody peek cages and locally harvested autograft  Surgeon: A., M.D.  Asst. Surgeon: None  Anesthesia: General  Indication: 71 year old female 1 month status post 2 level anterior cervical decompression and fusion. Patient did very well for a little over 1 week at which point she developed acute onset of neck pain with severe right upper extremity pain. These symptoms been refractory to conservative management. Workup demonstrates no evidence of subsidence of her C5-6 cage. CT scan demonstrates some retropulsed bone into the right C6 foramen with right C6 nerve root compression. Patient presents now for revision of her anterior cervical fusion and decompression of her right C6 nerve root.  Operative note: After induction anesthesia, patient position supine with neck slightly extended and held place of halter traction. Patient's anterior cervical regions prepped and draped sterilely. Incision made overlying C5. Dissection performed on the right. Retractor placed. Previously placed anterior plate is mentation was disassembled and removed. Screws into C5 release. Screws into C4 and C6 were fairly tight. There is some obvious fracturing of the inferior endplate of C5 and some fracturing of the superior endplate of C6. The cage between C5 and C6 was removed. Microscope front field using microdissection the spinal canal. Epidural scar was resected. The bodies of C5 and C6 for further undercut. On the right side was able to remove the  retropulsed bone fragment fully decompressing the spinal cord and right C6 nerve root. At this point a very thorough decompression been achieved. The disc space was now wider. An 8 mm interbody cage was packed with locally harvested autograft and packed in place. A 42 mm Atlantis anterior cervical plate was then placed or the C4-C5 and C6 levels. This an attachment or fluoroscopic guidance using 13 monitor variable angle screws. Wound is irrigated one final time. Intraoperative fluoroscopy revealed good position of the cages and instrumentation at the proper for level with normal alignment is fine. Hemostasis was assured with bipolar chart. Wounds and close in layers of Vicryl sutures. Steri-Strips and sterile dressing were applied. No apparent complications. Patient tolerated the procedure well and she returns to the recovery room postop.

## 2017-06-01 NOTE — Anesthesia Postprocedure Evaluation (Signed)
Anesthesia Post Note  Patient: Sara Diaz  Procedure(s) Performed: Procedure(s) (LRB): Revision on Anterior Cervical Decompression/Discectomy  Cervical four-six (N/A)     Patient location during evaluation: PACU Anesthesia Type: General Level of consciousness: awake and alert Pain management: pain level controlled Vital Signs Assessment: post-procedure vital signs reviewed and stable Respiratory status: spontaneous breathing, nonlabored ventilation, respiratory function stable and patient connected to nasal cannula oxygen Cardiovascular status: blood pressure returned to baseline and stable Postop Assessment: no signs of nausea or vomiting Anesthetic complications: no    Last Vitals:  Vitals:   06/01/17 1350 06/01/17 1357  BP: (!) 145/76   Pulse: 76 77  Resp: 16 16  Temp: (!) 36.3 C   SpO2: 98% 98%    Last Pain:  Vitals:   06/01/17 1350  TempSrc:   PainSc: 2                  ,JAMES TERRILL

## 2017-06-01 NOTE — Brief Op Note (Signed)
06/01/2017  1:01 PM  PATIENT:  Tinnie Gens  71 y.o. female  PRE-OPERATIVE DIAGNOSIS:  stenosis  POST-OPERATIVE DIAGNOSIS:  stenosis  PROCEDURE:  Procedure(s): Revision on Anterior Cervical Decompression/Discectomy  Cervical four-six (N/A)  SURGEON:  Surgeon(s) and Role:    * Earnie Larsson, MD - Primary  PHYSICIAN ASSISTANT:   ASSISTANTS:    ANESTHESIA:   general  EBL:  Total I/O In: 1000 [I.V.:1000] Out: 140 [Blood:140]  BLOOD ADMINISTERED:none  DRAINS: none   LOCAL MEDICATIONS USED:  NONE  SPECIMEN:  No Specimen  DISPOSITION OF SPECIMEN:  N/A  COUNTS:  YES  TOURNIQUET:  * No tourniquets in log *  DICTATION: .Dragon Dictation  PLAN OF CARE: Admit to inpatient   PATIENT DISPOSITION:  PACU - hemodynamically stable.   Delay start of Pharmacological VTE agent (>24hrs) due to surgical blood loss or risk of bleeding: yes

## 2017-06-01 NOTE — Transfer of Care (Signed)
Immediate Anesthesia Transfer of Care Note  Patient: Sara Diaz  Procedure(s) Performed: Procedure(s): Revision on Anterior Cervical Decompression/Discectomy  Cervical four-six (N/A)  Patient Location: PACU  Anesthesia Type:General  Level of Consciousness: awake, alert  and oriented  Airway & Oxygen Therapy: Patient Spontanous Breathing and Patient connected to nasal cannula oxygen  Post-op Assessment: Report given to RN, Post -op Vital signs reviewed and stable and Patient moving all extremities  Post vital signs: Reviewed and stable  Last Vitals:  Vitals:   06/01/17 1006 06/01/17 1010  BP: (!) 202/77 (!) 180/86  Pulse: 66   Resp: 18   Temp: 36.7 C   SpO2: 99%     Last Pain:  Vitals:   06/01/17 1006  TempSrc: Oral      Patients Stated Pain Goal: 3 (61/44/31 [id number])  Complications: No apparent anesthesia complications

## 2017-06-01 NOTE — H&P (Signed)
Sara Diaz is an 71 y.o. female.   Chief Complaint: Right arm pain HPI: 71 year old female status post C4-5 and C5-6 anterior cervical discectomy with fusion approximately 1 month ago. Patient presents now with severe right upper extremity pain failing conservative management. Workup demonstrates evidence of either some residual osteophyte with compression of her right C6 nerve root or some degree of fractured endplate related to insertion of her interbody cage. The patient presents now for revision anterior cervical fusion at C5-6 in hopes of improving her symptoms.  Past Medical History:  Diagnosis Date  . Arthritis    "neck ,hands, back" (05/17/2017)  . Barrett esophagus   . Complication of anesthesia   . Depression   . DVT (deep vein thrombosis) in pregnancy ([place]) 1973   BLE; "I had a 10# pregnancy"  . Dyspnea    with exertion  . GERD (gastroesophageal reflux disease)    off prilosec .developed cough .thought to be allergic  to prilosec  . Headache(784.0)    "stopped having them daily after back OR in 2012; restarted in 2017; had 1-2/month" (05/17/2017)  . High cholesterol   . History of kidney stones 2006,2007  . Hypertension   . Hypothyroidism   . Neuromuscular disorder ([facility])    weakness r leg.  . Osteoporosis   . Pneumonia 2015-2016 X 2  . PONV (postoperative nausea and vomiting)   . Pulmonary embolism ([place]) 1973 X 2  . RA (rheumatoid arthritis) ([place])   . Type II diabetes mellitus ([place])    Type II    Past Surgical History:  Procedure Laterality Date  . ABDOMINAL ADHESION SURGERY  1992  . ABDOMINAL HYSTERECTOMY  1990  . ANTERIOR CERVICAL DECOMP/DISCECTOMY FUSION  05/17/2017  . ANTERIOR CERVICAL DECOMP/DISCECTOMY FUSION N/A 05/17/2017   Procedure: Anterior Cervical Decompression/Discectomy Fusion - Cervical four-Cervical five - Cervical five-Cervical six;  Surgeon: Earnie Larsson, MD;  Location: [facility];  Service: Neurosurgery;  Laterality: N/A;  . APPENDECTOMY    . BACK  SURGERY    . BREAST SURGERY Right 1968   2 tumors on breast -benign  . BREAST SURGERY Right 1980s   1 tumor on breast -benign  . CHOLECYSTECTOMY OPEN    . CYST EXCISION Left 2001   index finger  . CYSTOSCOPY W/ STONE MANIPULATION  2006-2007 X 3  . DILATION AND CURETTAGE OF UTERUS  1984  . ENTEROCELE REPAIR  2007  . ESOPHAGOGASTRODUODENOSCOPY (EGD) WITH ESOPHAGEAL DILATION  4-5 Xs  . EYE SURGERY    . INCONTINENCE SURGERY  1991; 1993  . JOINT REPLACEMENT    . JOINT REPLACEMENT Left 2017   thumb; "replaced joint w/tendon"  . KNEE ARTHROSCOPY Right   . LAMINECTOMY  09/04/2011   Procedure: LUMBAR LAMINECTOMY FOR TUMOR;  Surgeon: Cooper Render ;  Location: [facility];  Service: Neurosurgery;  Laterality: N/A;  Lumbar five laminectomy for resection of intradural tumor  . REFRACTIVE SURGERY Bilateral   . TENDON REPAIR Left 2017   ruptured tendon on thumb 2 wk after joint replaced w/tendon    History reviewed. No pertinent family history. Social History:  reports that she has never smoked. She has never used smokeless tobacco. She reports that she does not drink alcohol or use drugs.  Allergies:  Allergies  Allergen Reactions  . Etanercept Other (See Comments)    Excessive sweating  . Sulfamethoxazole-Trimethoprim Itching  . Lisinopril Other (See Comments)    headaches  . Nitrofurantoin Rash    'has red dye in it'  .  Canagliflozin Other (See Comments)    Constant yeast infection  . Contrast Media [Iodinated Diagnostic Agents] Other (See Comments)    Convulsions,seizures   . Fluoxetine Other (See Comments)    Depression  . Ioxaglate Other (See Comments)    Convulsions  . Nabumetone Other (See Comments)    unknown  . Tramadol Nausea And Vomiting  . Adalimumab Rash  . Metformin Other (See Comments)    GI upset  . Olmesartan Nausea And Vomiting  . Penicillins Rash    Has patient had a PCN reaction causing immediate rash, facial/tongue/throat swelling, SOB or lightheadedness  with hypotension: Unknown Has patient had a PCN reaction causing severe rash involving mucus membranes or skin necrosis: Unknown Has patient had a PCN reaction that required hospitalization: Unknown Has patient had a PCN reaction occurring within the last 10 years: No If all of the above answers are "NO", then may proceed with Cephalosporin use.   . Red Dye Rash    Rash in the face    Medications Prior to Admission  Medication Sig Dispense Refill  . Abatacept (ORENCIA) 125 MG/ML SOSY Inject 125 mg into the skin every 7 (seven) days. Saturdays    . amitriptyline (ELAVIL) 50 MG tablet Take 50 mg by mouth at bedtime.      Marland Kitchen aspirin-acetaminophen-caffeine (EXCEDRIN MIGRAINE) 250-250-65 MG tablet Take 2 tablets by mouth daily as needed for headache.     . Cholecalciferol (HM VITAMIN D3) 4000 UNITS CAPS Take 4,000 Units by mouth daily.      . cycloSPORINE (RESTASIS) 0.05 % ophthalmic emulsion Place 1 drop into both eyes 2 (two) times daily.     . Dulaglutide (TRULICITY) 2.22 LN/9.8XQ SOPN Inject 0.75 mg into the skin every 7 (seven) days. Sunday    . estradiol (ESTRACE) 0.5 MG tablet Take 0.5 mg by mouth daily.      . folic acid (FOLVITE) 119 MCG tablet Take 1,600 mcg by mouth daily.    Marland Kitchen HYDROcodone-acetaminophen (NORCO/VICODIN) 5-325 MG tablet Take 1 tablet by mouth every 6 (six) hours as needed for moderate pain.    Marland Kitchen ibuprofen (ADVIL,MOTRIN) 200 MG tablet Take 400 mg by mouth every 8 (eight) hours as needed for mild pain.    . Insulin Detemir (LEVEMIR FLEXPEN) 100 UNIT/ML Pen Inject 30 Units into the skin daily.     . lansoprazole (PREVACID) 30 MG capsule Take 30 mg by mouth 2 (two) times daily before a meal.    . levothyroxine (SYNTHROID, LEVOTHROID) 112 MCG tablet Take 112 mcg by mouth at bedtime.     . methotrexate (RHEUMATREX) 2.5 MG tablet Take 17.5 mg by mouth every 7 (seven) days. Monday    . Multiple Vitamins-Minerals (ADULT GUMMY PO) Take 1 each by mouth daily. Women's    . nadolol  (CORGARD) 80 MG tablet Take 120 mg by mouth every evening.     . pravastatin (PRAVACHOL) 40 MG tablet Take 60 mg by mouth at bedtime. Takes 1.5 tablet    . citalopram (CELEXA) 20 MG tablet Take 20 mg by mouth daily.    . cyclobenzaprine (FLEXERIL) 10 MG tablet Take 1 tablet (10 mg total) by mouth 3 (three) times daily as needed for muscle spasms. (Patient not taking: Reported on 05/31/2017) 30 tablet 0  . methylPREDNISolone (MEDROL DOSEPAK) 4 MG TBPK tablet Take 6 tablets on day 1 then decrease by 1 tablet daily until finished.  Take at the first sign of a rheumatoid arthritis flare    . traMADol (  ULTRAM) 50 MG tablet Take 1-2 tablets (50-100 mg total) by mouth every 6 (six) hours as needed. (Patient not taking: Reported on 05/31/2017) 40 tablet 0    Results for orders placed or performed during the hospital encounter of 06/01/17 (from the past 48 hour(s))  Glucose, capillary     Status: Abnormal   Collection Time: 06/01/17 10:14 AM  Result Value Ref Range   Glucose-Capillary 145 (H) 65 - 99 mg/dL   Comment 1 Notify RN    Comment 2 Document in Chart   Basic metabolic panel     Status: Abnormal   Collection Time: 06/01/17 10:16 AM  Result Value Ref Range   Sodium 137 135 - 145 mmol/L   Potassium 4.3 3.5 - 5.1 mmol/L    Comment: HEMOLYSIS AT THIS LEVEL MAY AFFECT RESULT   Chloride 103 101 - 111 mmol/L   CO2 28 22 - 32 mmol/L   Glucose, Bld 149 (H) 65 - 99 mg/dL   BUN 11 6 - 20 mg/dL   Creatinine, Ser 0.77 0.44 - 1.00 mg/dL   Calcium 8.9 8.9 - 10.3 mg/dL   GFR calc non Af Amer >60 >60 mL/min   GFR calc Af Amer >60 >60 mL/min    Comment: (NOTE) The eGFR has been calculated using the CKD EPI equation. This calculation has not been validated in all clinical situations. eGFR's persistently <60 mL/min signify possible Chronic Kidney Disease.    Anion gap 6 5 - 15  CBC     Status: None   Collection Time: 06/01/17 10:16 AM  Result Value Ref Range   WBC 7.3 4.0 - 10.5 K/uL   RBC 4.27 3.87  - 5.11 MIL/uL   Hemoglobin 13.1 12.0 - 15.0 g/dL   HCT 38.4 36.0 - 46.0 %   MCV 89.9 78.0 - 100.0 fL   MCH 30.7 26.0 - 34.0 pg   MCHC 34.1 30.0 - 36.0 g/dL   RDW 13.6 11.5 - 15.5 %   Platelets 203 150 - 400 K/uL   Ct Cervical Spine Wo Contrast  Result Date: 05/31/2017 CLINICAL DATA:  Cervical fusion surgery 05/17/2017. Worsening neck pain and right arm pain. EXAM: CT CERVICAL SPINE WITHOUT CONTRAST TECHNIQUE: Multidetector CT imaging of the cervical spine was performed without intravenous contrast. Multiplanar CT image reconstructions were also generated. COMPARISON:  Operative images 8 11/2016.  MRI 03/08/2017. FINDINGS: Alignment: Normal Skull base and vertebrae: Skullbase is normal. Ordinary osteoarthritis at the C1-2 articulation. Previous ACDF from C4 through C6. Hardware appears well positioned. See below for individual level description. Soft tissues and spinal canal: No neck soft tissue lesion of significance. Expected edema along the right-sided operative approach. Disc levels:  C2-3:  Negative C3-4:  Minimal bulging of the disc.  No canal or foraminal stenosis. C4-5: Previous ACDF. Hardware and interbody fusion material well positioned. Apparent wide patency of the central canal. Left foraminal encroachment by osteophyte and soft tissue density material could affect the left C5 nerve. C5-6: Heart were well positioned. Right-sided bone and soft tissue density material encroaches upon the spinal canal and intervertebral foramina, right more than left. Ongoing neural compression could occur, particularly on the right. C6-7: Spondylosis with endplate osteophytes and mild bulging of the disc. Mild canal narrowing. Bilateral foraminal narrowing left more than right. C7-T1: Mild bulging of the disc.  No compressive stenosis. Upper chest: Negative Other: None IMPRESSION: Previous ACDF from C4 through C6. Hardware and interbody fusion material well positioned. Continued encroachment in the left canal and  left foramen at C4-5 an in the right canal and both neural foramina at C5-6. This appears to be on the basis primarily of bone/osteophyte that there could be some associated disc material. No change an the appearance of spondylosis at C6-7 with foraminal stenosis left worse than right. Electronically Signed   By: Nelson Chimes M.D.   On: 05/31/2017 13:29    Pertinent items noted in HPI and remainder of comprehensive ROS otherwise negative.  Blood pressure (!) 180/86, pulse 66, temperature 98 F (36.7 C), temperature source Oral, resp. rate 18, weight 74.8 kg (165 lb), SpO2 99 %.  Patient is awake and alert. She is oriented and appropriate. Her speech is fluent. Judgment and insight are intact. Motor examination with some mild weakness of her wrist extensor muscles otherwise motor strength intact. Sensory exam is decreased sensation to light touch in her right C6 dermatome. Deep tendon versus a normal active. No evidence of long track signs. Gait and posture normal. Examination head ears eyes and thirds and alert. Chest and abdomen are benign. Extremities are free from injury or deformity. Assessment/Plan C5-6 foraminal stenosis secondary to either endplate fracture or residual osteophyte following anterior cervical decompression and fusion. Plan reexploration of C5-6 anterior cervical fusion with revision decompression and repeat fusion. Risks and benefits of been explained. Patient wishes to proceed.  , A 06/01/2017, 11:13 AM

## 2017-06-02 LAB — GLUCOSE, CAPILLARY: GLUCOSE-CAPILLARY: 235 mg/dL — AB (ref 65–99)

## 2017-06-02 NOTE — Discharge Instructions (Signed)

## 2017-06-02 NOTE — Progress Notes (Signed)
Patient alert and oriented, mae's well, voiding adequate amount of urine, swallowing without difficulty, no c/o pain at time of discharge. Patient discharged home with family. Script and discharged instructions given to patient. Patient and family stated understanding of instructions given. Patient has an appointment with Dr. Pool  

## 2017-06-02 NOTE — Discharge Summary (Signed)
Physician Discharge Summary  Patient ID: Sara Diaz MRN: [id number] DOB/AGE: [birth year] 71 y.o.  Admit date: 06/01/2017 Discharge date: 06/02/2017  Admission Diagnoses:  Discharge Diagnoses:  Active Problems:   C5 vertebral fracture [facility])   Discharged Condition: good  Hospital Course: Patient admitted revision of her anterior cervical decompression infusion. Postoperative she is doing very well. Preoperative neck and upper extremity pain resolved. Ambulating without difficulty. Ready for discharge home. Consults:   Significant Diagnostic Studies:   Treatments:   Discharge Exam: Blood pressure (!) 155/77, pulse 78, temperature 98.2 F (36.8 C), resp. rate 16, weight 74.8 kg (165 lb), SpO2 99 %. Awake and alert. Oriented and appropriate  Nerve function intact. Motor and sensory function extremities normal. Wound clean and dry. Chest and abdomen benign.  Disposition: 01-Home or Self Care  Discharge Instructions    Incentive spirometry RT    Complete by:  As directed      Allergies as of 06/02/2017      Reactions   Etanercept Other (See Comments)   Excessive sweating   Sulfamethoxazole-trimethoprim Itching   Lisinopril Other (See Comments)   headaches   Nitrofurantoin Rash   'has red dye in it'   Canagliflozin Other (See Comments)   Constant yeast infection   Contrast Media [iodinated Diagnostic Agents] Other (See Comments)   Convulsions,seizures    Fluoxetine Other (See Comments)   Depression   Ioxaglate Other (See Comments)   Convulsions   Nabumetone Other (See Comments)   unknown   Tramadol Nausea And Vomiting   Adalimumab Rash   Metformin Other (See Comments)   GI upset   Olmesartan Nausea And Vomiting   Penicillins Rash   Has patient had a PCN reaction causing immediate rash, facial/tongue/throat swelling, SOB or lightheadedness with hypotension: Unknown Has patient had a PCN reaction causing severe rash involving mucus membranes or skin necrosis:  Unknown Has patient had a PCN reaction that required hospitalization: Unknown Has patient had a PCN reaction occurring within the last 10 years: No If all of the above answers are "NO", then may proceed with Cephalosporin use.   Red Dye Rash   Rash in the face      Medication List    TAKE these medications   ADULT GUMMY PO Take 1 each by mouth daily. Women's   amitriptyline 50 MG tablet Commonly known as:  ELAVIL Take 50 mg by mouth at bedtime.   aspirin-acetaminophen-caffeine 250-250-65 MG tablet Commonly known as:  EXCEDRIN MIGRAINE Take 2 tablets by mouth daily as needed for headache.   citalopram 20 MG tablet Commonly known as:  CELEXA Take 20 mg by mouth daily.   cyclobenzaprine 10 MG tablet Commonly known as:  FLEXERIL Take 1 tablet (10 mg total) by mouth 3 (three) times daily as needed for muscle spasms.   cycloSPORINE 0.05 % ophthalmic emulsion Commonly known as:  RESTASIS Place 1 drop into both eyes 2 (two) times daily.   estradiol 0.5 MG tablet Commonly known as:  ESTRACE Take 0.5 mg by mouth daily.   folic acid 284 MCG tablet Commonly known as:  FOLVITE Take 1,600 mcg by mouth daily.   HM VITAMIN D3 4000 units Caps Generic drug:  Cholecalciferol Take 4,000 Units by mouth daily.   HYDROcodone-acetaminophen 5-325 MG tablet Commonly known as:  NORCO/VICODIN Take 1 tablet by mouth every 6 (six) hours as needed for moderate pain.   ibuprofen 200 MG tablet Commonly known as:  ADVIL,MOTRIN Take 400 mg by mouth every 8 (eight)  hours as needed for mild pain.   lansoprazole 30 MG capsule Commonly known as:  PREVACID Take 30 mg by mouth 2 (two) times daily before a meal.   LEVEMIR FLEXPEN 100 UNIT/ML Pen Generic drug:  Insulin Detemir Inject 30 Units into the skin daily.   levothyroxine 112 MCG tablet Commonly known as:  SYNTHROID, LEVOTHROID Take 112 mcg by mouth at bedtime.   methotrexate 2.5 MG tablet Commonly known as:  RHEUMATREX Take 17.5 mg  by mouth every 7 (seven) days. Monday   methylPREDNISolone 4 MG Tbpk tablet Commonly known as:  MEDROL DOSEPAK Take 6 tablets on day 1 then decrease by 1 tablet daily until finished.  Take at the first sign of a rheumatoid arthritis flare   nadolol 80 MG tablet Commonly known as:  CORGARD Take 120 mg by mouth every evening.   ORENCIA 125 MG/ML Sosy Generic drug:  Abatacept Inject 125 mg into the skin every 7 (seven) days. Saturdays   pravastatin 40 MG tablet Commonly known as:  PRAVACHOL Take 60 mg by mouth at bedtime. Takes 1.5 tablet   traMADol 50 MG tablet Commonly known as:  ULTRAM Take 1-2 tablets (50-100 mg total) by mouth every 6 (six) hours as needed.   TRULICITY 8.11 SR/1.5XY Sopn Generic drug:  Dulaglutide Inject 0.75 mg into the skin every 7 (seven) days. Sunday      Follow-up Information    Earnie Larsson, MD Follow up.   Specialty:  Neurosurgery Contact information: [address] [place] [address] [phone]           Signed: Charlie Pitter 06/02/2017, 9:21 AM

## 2017-06-04 ENCOUNTER — Encounter (HOSPITAL_COMMUNITY): Payer: Self-pay | Admitting: Neurosurgery

## 2017-06-12 ENCOUNTER — Other Ambulatory Visit (HOSPITAL_COMMUNITY): Payer: Self-pay | Admitting: *Deleted

## 2017-06-13 ENCOUNTER — Ambulatory Visit (HOSPITAL_COMMUNITY)
Admission: RE | Admit: 2017-06-13 | Discharge: 2017-06-13 | Disposition: A | Discharge status: Home / Self Care | Payer: PPO | Source: Ambulatory Visit | Attending: Endocrinology | Admitting: Endocrinology

## 2017-06-13 DIAGNOSIS — M4802 Spinal stenosis, cervical region: Secondary | ICD-10-CM | POA: Diagnosis not present

## 2017-06-13 DIAGNOSIS — M810 Age-related osteoporosis without current pathological fracture: Secondary | ICD-10-CM | POA: Insufficient documentation

## 2017-06-13 MED ORDER — ZOLEDRONIC ACID 5 MG/100ML IV SOLN
5.0000 mg | Freq: Once | INTRAVENOUS | Status: AC
  Administered 2017-06-13: 5 mg via INTRAVENOUS

## 2017-06-13 MED ORDER — ZOLEDRONIC ACID 5 MG/100ML IV SOLN
INTRAVENOUS | Status: AC
Start: 2017-06-13 — End: 2017-06-13
  Administered 2017-06-13: 5 mg via INTRAVENOUS
  Filled 2017-06-13: qty 100

## 2017-06-15 DIAGNOSIS — E039 Hypothyroidism, unspecified: Secondary | ICD-10-CM | POA: Diagnosis not present

## 2017-06-15 DIAGNOSIS — E1165 Type 2 diabetes mellitus with hyperglycemia: Secondary | ICD-10-CM | POA: Diagnosis not present

## 2017-06-15 DIAGNOSIS — Z794 Long term (current) use of insulin: Secondary | ICD-10-CM | POA: Diagnosis not present

## 2017-06-15 DIAGNOSIS — E7800 Pure hypercholesterolemia, unspecified: Secondary | ICD-10-CM | POA: Diagnosis not present

## 2017-06-21 DIAGNOSIS — Z79899 Other long term (current) drug therapy: Secondary | ICD-10-CM | POA: Diagnosis not present

## 2017-06-21 DIAGNOSIS — M0579 Rheumatoid arthritis with rheumatoid factor of multiple sites without organ or systems involvement: Secondary | ICD-10-CM | POA: Diagnosis not present

## 2017-06-21 DIAGNOSIS — E1165 Type 2 diabetes mellitus with hyperglycemia: Secondary | ICD-10-CM | POA: Diagnosis not present

## 2017-06-21 DIAGNOSIS — E7800 Pure hypercholesterolemia, unspecified: Secondary | ICD-10-CM | POA: Diagnosis not present

## 2017-06-21 DIAGNOSIS — I10 Essential (primary) hypertension: Secondary | ICD-10-CM | POA: Diagnosis not present

## 2017-06-21 DIAGNOSIS — E039 Hypothyroidism, unspecified: Secondary | ICD-10-CM | POA: Diagnosis not present

## 2017-06-28 DIAGNOSIS — M4802 Spinal stenosis, cervical region: Secondary | ICD-10-CM | POA: Diagnosis not present

## 2017-06-28 DIAGNOSIS — N3001 Acute cystitis with hematuria: Secondary | ICD-10-CM | POA: Diagnosis not present

## 2017-07-17 DIAGNOSIS — M4802 Spinal stenosis, cervical region: Secondary | ICD-10-CM | POA: Diagnosis not present

## 2017-07-30 DIAGNOSIS — J209 Acute bronchitis, unspecified: Secondary | ICD-10-CM | POA: Diagnosis not present

## 2017-07-30 DIAGNOSIS — J0100 Acute maxillary sinusitis, unspecified: Secondary | ICD-10-CM | POA: Diagnosis not present

## 2017-08-03 DIAGNOSIS — H1033 Unspecified acute conjunctivitis, bilateral: Secondary | ICD-10-CM | POA: Diagnosis not present

## 2017-08-03 DIAGNOSIS — J9801 Acute bronchospasm: Secondary | ICD-10-CM | POA: Diagnosis not present

## 2017-08-03 DIAGNOSIS — J3089 Other allergic rhinitis: Secondary | ICD-10-CM | POA: Diagnosis not present

## 2017-08-06 DIAGNOSIS — J069 Acute upper respiratory infection, unspecified: Secondary | ICD-10-CM | POA: Diagnosis not present

## 2017-08-06 DIAGNOSIS — H6982 Other specified disorders of Eustachian tube, left ear: Secondary | ICD-10-CM | POA: Diagnosis not present

## 2017-08-07 DIAGNOSIS — R05 Cough: Secondary | ICD-10-CM | POA: Diagnosis not present

## 2017-08-07 DIAGNOSIS — R062 Wheezing: Secondary | ICD-10-CM | POA: Diagnosis not present

## 2017-08-14 DIAGNOSIS — J0110 Acute frontal sinusitis, unspecified: Secondary | ICD-10-CM | POA: Diagnosis not present

## 2017-08-14 DIAGNOSIS — R05 Cough: Secondary | ICD-10-CM | POA: Diagnosis not present

## 2017-08-28 DIAGNOSIS — M4802 Spinal stenosis, cervical region: Secondary | ICD-10-CM | POA: Diagnosis not present

## 2017-09-17 DIAGNOSIS — E7800 Pure hypercholesterolemia, unspecified: Secondary | ICD-10-CM | POA: Diagnosis not present

## 2017-09-17 DIAGNOSIS — E039 Hypothyroidism, unspecified: Secondary | ICD-10-CM | POA: Diagnosis not present

## 2017-09-17 DIAGNOSIS — Z794 Long term (current) use of insulin: Secondary | ICD-10-CM | POA: Diagnosis not present

## 2017-09-17 DIAGNOSIS — E1165 Type 2 diabetes mellitus with hyperglycemia: Secondary | ICD-10-CM | POA: Diagnosis not present

## 2017-09-17 DIAGNOSIS — E559 Vitamin D deficiency, unspecified: Secondary | ICD-10-CM | POA: Diagnosis not present

## 2017-09-20 DIAGNOSIS — M1712 Unilateral primary osteoarthritis, left knee: Secondary | ICD-10-CM | POA: Diagnosis not present

## 2017-09-21 DIAGNOSIS — E1165 Type 2 diabetes mellitus with hyperglycemia: Secondary | ICD-10-CM | POA: Diagnosis not present

## 2017-09-21 DIAGNOSIS — I10 Essential (primary) hypertension: Secondary | ICD-10-CM | POA: Diagnosis not present

## 2017-09-21 DIAGNOSIS — E7800 Pure hypercholesterolemia, unspecified: Secondary | ICD-10-CM | POA: Diagnosis not present

## 2017-09-21 DIAGNOSIS — E039 Hypothyroidism, unspecified: Secondary | ICD-10-CM | POA: Diagnosis not present

## 2017-09-27 IMAGING — CT CT CERVICAL SPINE W/O CM
5 series · 16 of 33 positions shown, 18 images · non-contrast
Comparison: Operative images 8 [DATE].  MRI 03/08/2017.

CLINICAL DATA: Cervical fusion surgery 05/17/2017. Worsening neck
pain and right arm pain.

EXAM:
CT CERVICAL SPINE WITHOUT CONTRAST
TECHNIQUE: Multidetector CT imaging of the cervical spine was performed without
intravenous contrast. Multiplanar CT image reconstructions were also
generated.

[Series 3: c spine bone · axial · 0.23mm/px · z∈[+150,+212]mm · 2 of 77 slices shown]
[im 26/77  bone]
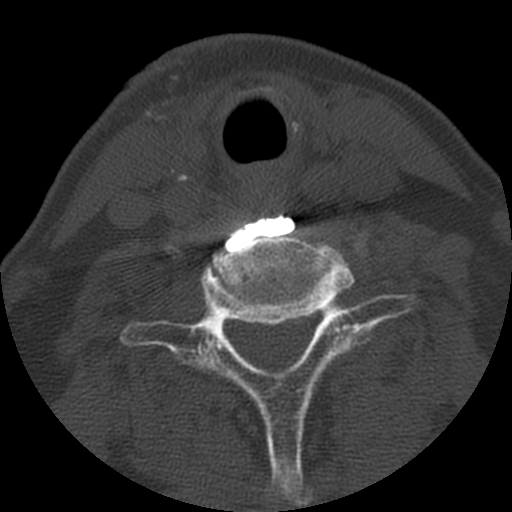
[im 51/77  bone]
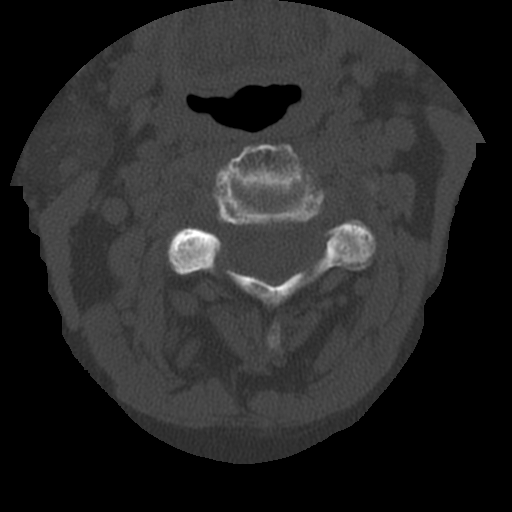

[Series 4: c spine soft · axial · 0.23mm/px · z∈[+150,+212]mm · 2 of 77 slices shown]
[im 26/77  soft-tissue]
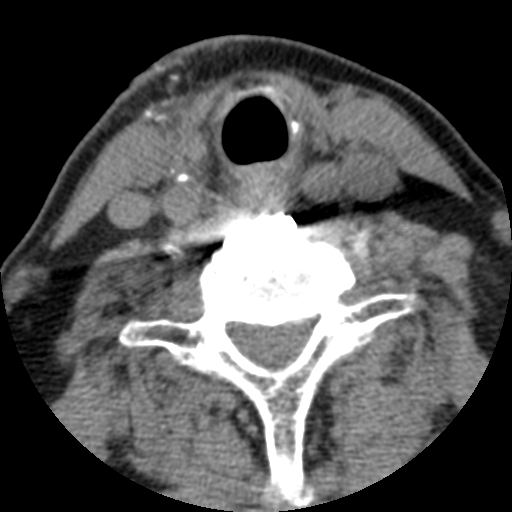
[im 51/77  soft-tissue]
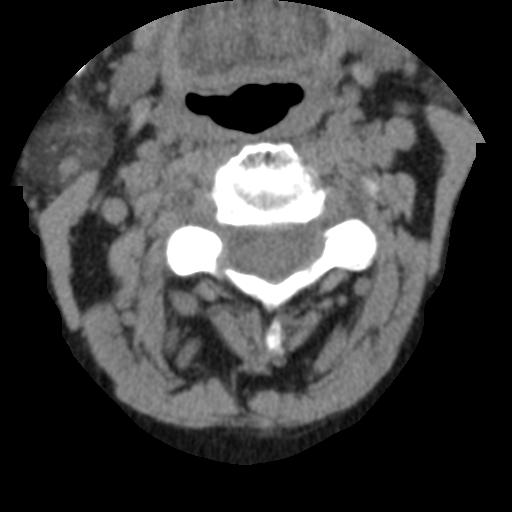

[Series 200: coronal · coronal · 0.38mm/px · 3 of 35 slices shown]
[im 7/35  bone]
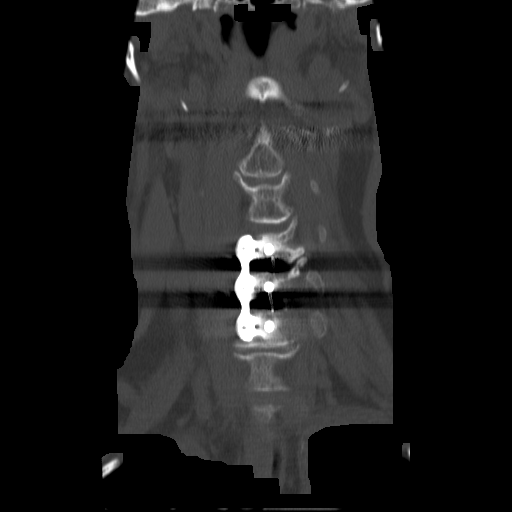
[im 14/35  bone]
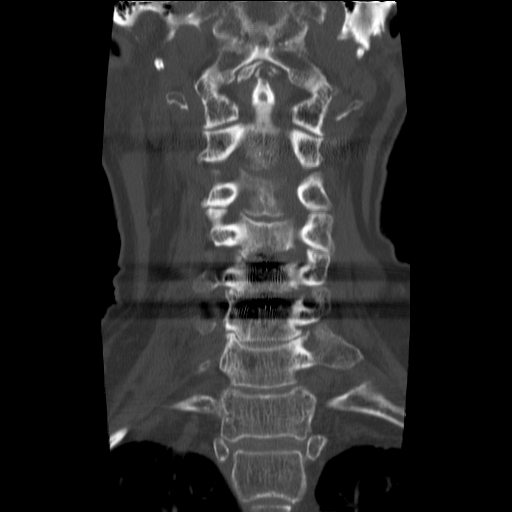
[im 21/35  bone]
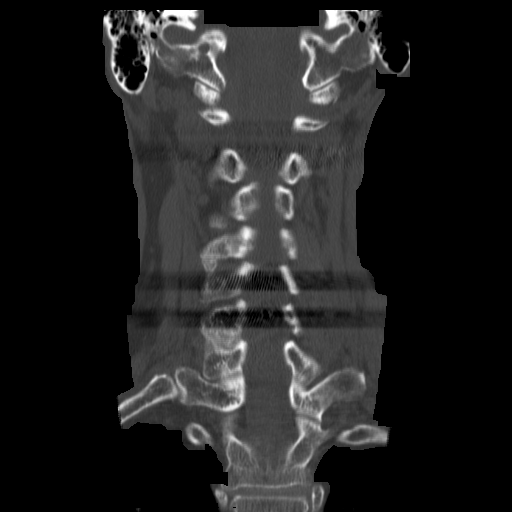

[Series 201: sagittal · sagittal · 0.38mm/px · 5 of 45 slices shown, 6 images]
[im 15/45  bone]
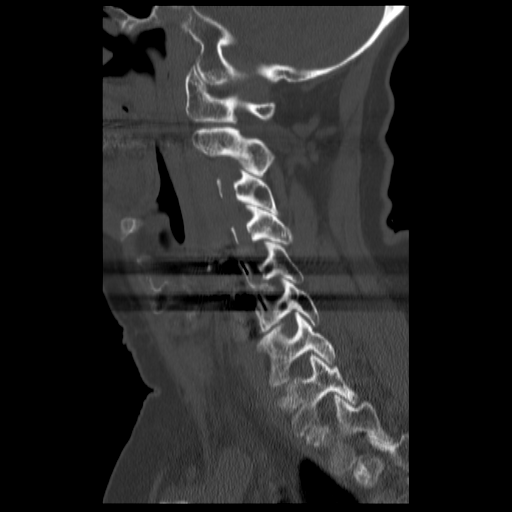
[im 19/45  bone]
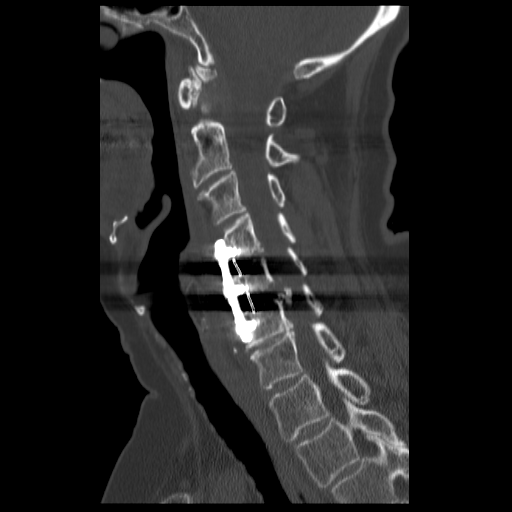
[im 23/45  soft-tissue]
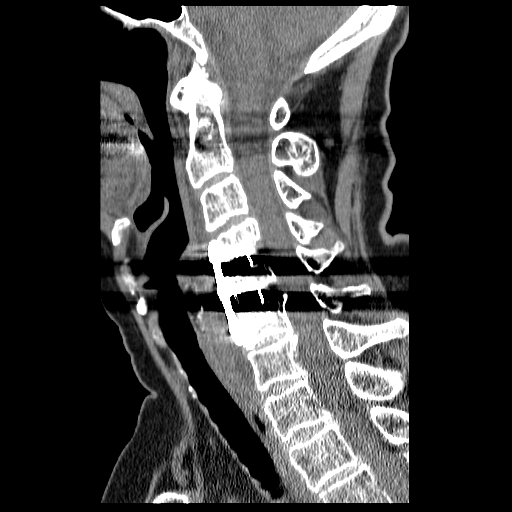
[im 23/45  bone]
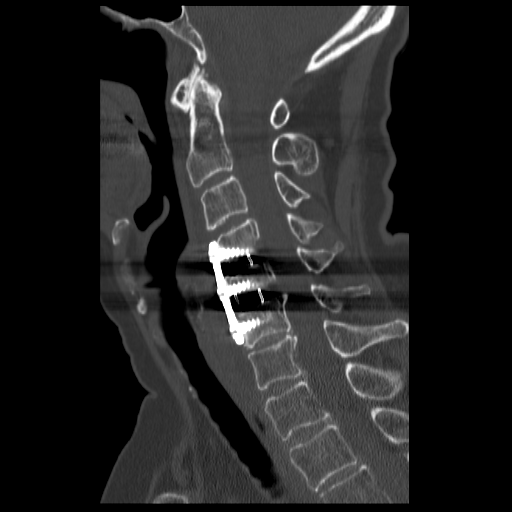
[im 26/45  bone]
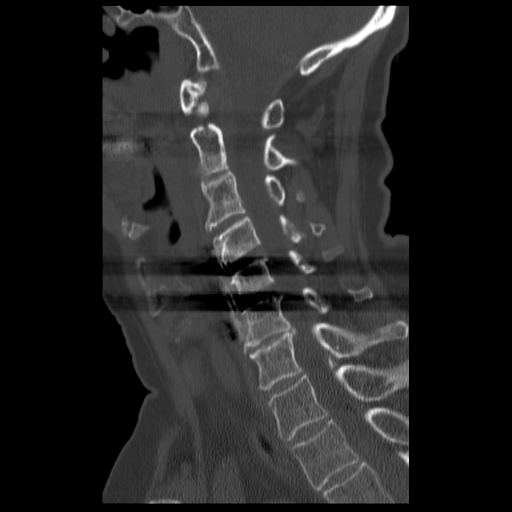
[im 30/45  bone]
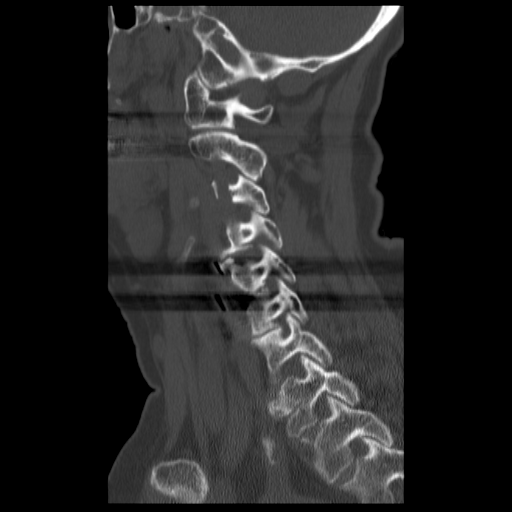

[Series 202: angled axial · axial · 0.29mm/px · z∈[+89,+214]mm · 4 of 113 slices shown, 5 images]
[im 23/113  soft-tissue]
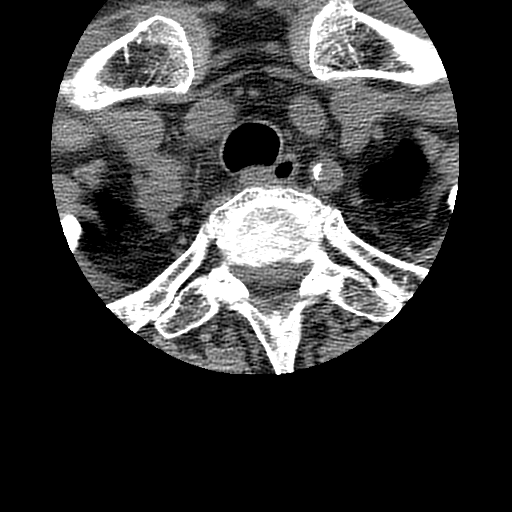
[im 23/113  bone]
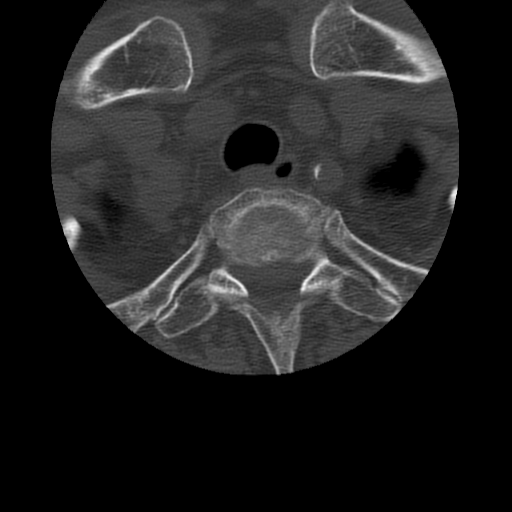
[im 45/113  bone]
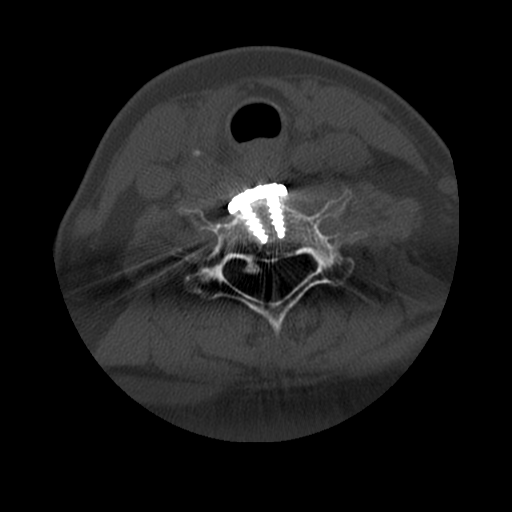
[im 68/113  bone]
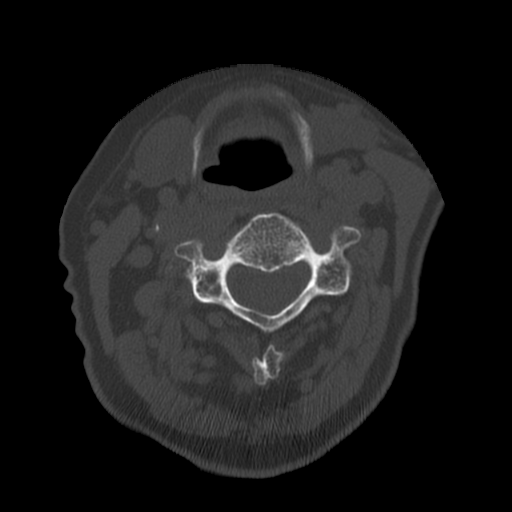
[im 90/113  bone]
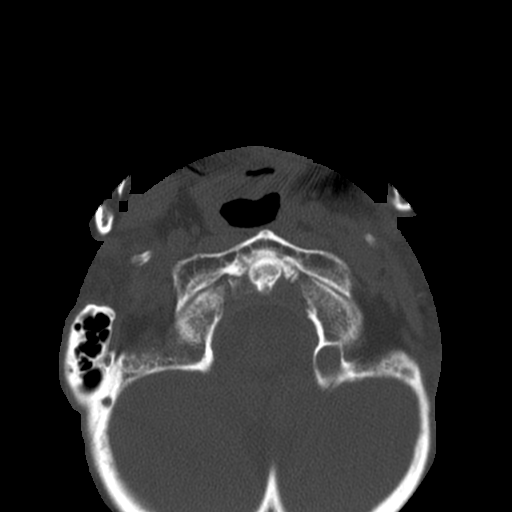

[16 of 33 positions shown; findings below may reference images not displayed]

FINDINGS: Alignment: Normal

Skull base and vertebrae: Skullbase is normal. Ordinary
osteoarthritis at the C1-2 articulation. Previous ACDF from C4
through C6. Hardware appears well positioned. See below for
individual level description.

Soft tissues and spinal canal: No neck soft tissue lesion of
significance. Expected edema along the right-sided operative
approach.

Disc levels:  C2-3:  Negative

C3-4:  Minimal bulging of the disc.  No canal or foraminal stenosis.

C4-5: Previous ACDF. Hardware and interbody fusion material well
positioned. Apparent wide patency of the central canal. Left
foraminal encroachment by osteophyte and soft tissue density
material could affect the left C5 nerve.

C5-6: Heart were well positioned. Right-sided bone and soft tissue
density material encroaches upon the spinal canal and intervertebral
foramina, right more than left. Ongoing neural compression could
occur, particularly on the right.

C6-7: Spondylosis with endplate osteophytes and mild bulging of the
disc. Mild canal narrowing. Bilateral foraminal narrowing left more
than right.

C7-T1: Mild bulging of the disc.  No compressive stenosis.

Upper chest: Negative

Other: None
IMPRESSION: Previous ACDF from C4 through C6. Hardware and interbody fusion
material well positioned.

Continued encroachment in the left canal and left foramen at C4-5 an
in the right canal and both neural foramina at C5-6. This appears to
be on the basis primarily of bone/osteophyte that there could be
some associated disc material.

No change an the appearance of spondylosis at C6-7 with foraminal
stenosis left worse than right.

## 2018-06-11 ENCOUNTER — Other Ambulatory Visit (HOSPITAL_COMMUNITY): Payer: Self-pay | Admitting: *Deleted

## 2018-06-12 ENCOUNTER — Ambulatory Visit (HOSPITAL_COMMUNITY)
Admission: RE | Admit: 2018-06-12 | Discharge: 2018-06-12 | Disposition: A | Discharge status: Home / Self Care | Payer: Medicare HMO | Source: Ambulatory Visit | Attending: Endocrinology | Admitting: Endocrinology

## 2018-06-12 DIAGNOSIS — M810 Age-related osteoporosis without current pathological fracture: Secondary | ICD-10-CM | POA: Insufficient documentation

## 2018-06-12 MED ORDER — ZOLEDRONIC ACID 5 MG/100ML IV SOLN
5.0000 mg | Freq: Once | INTRAVENOUS | Status: AC
  Administered 2018-06-12: 5 mg via INTRAVENOUS

## 2018-06-12 MED ORDER — ZOLEDRONIC ACID 5 MG/100ML IV SOLN
INTRAVENOUS | Status: AC
  Administered 2018-06-12: 5 mg via INTRAVENOUS
  Filled 2018-06-12: qty 100

## 2018-08-09 ENCOUNTER — Other Ambulatory Visit (HOSPITAL_COMMUNITY): Payer: PPO

## 2018-08-14 ENCOUNTER — Encounter: Payer: Self-pay | Admitting: Physician Assistant

## 2018-08-14 DIAGNOSIS — Z9889 Other specified postprocedural states: Secondary | ICD-10-CM

## 2018-08-14 DIAGNOSIS — G709 Myoneural disorder, unspecified: Secondary | ICD-10-CM | POA: Diagnosis present

## 2018-08-14 DIAGNOSIS — R112 Nausea with vomiting, unspecified: Secondary | ICD-10-CM | POA: Diagnosis present

## 2018-08-14 DIAGNOSIS — I10 Essential (primary) hypertension: Secondary | ICD-10-CM | POA: Diagnosis present

## 2018-08-14 DIAGNOSIS — I2699 Other pulmonary embolism without acute cor pulmonale: Secondary | ICD-10-CM | POA: Diagnosis present

## 2018-08-14 DIAGNOSIS — M069 Rheumatoid arthritis, unspecified: Secondary | ICD-10-CM | POA: Diagnosis present

## 2018-08-14 DIAGNOSIS — E119 Type 2 diabetes mellitus without complications: Secondary | ICD-10-CM

## 2018-08-14 DIAGNOSIS — T4145XA Adverse effect of unspecified anesthetic, initial encounter: Secondary | ICD-10-CM | POA: Diagnosis present

## 2018-08-14 DIAGNOSIS — F32A Depression, unspecified: Secondary | ICD-10-CM | POA: Diagnosis present

## 2018-08-14 DIAGNOSIS — T8859XA Other complications of anesthesia, initial encounter: Secondary | ICD-10-CM | POA: Diagnosis present

## 2018-08-14 DIAGNOSIS — K2270 Barrett's esophagus without dysplasia: Secondary | ICD-10-CM | POA: Diagnosis present

## 2018-08-14 DIAGNOSIS — F329 Major depressive disorder, single episode, unspecified: Secondary | ICD-10-CM | POA: Diagnosis present

## 2018-08-14 DIAGNOSIS — E039 Hypothyroidism, unspecified: Secondary | ICD-10-CM | POA: Diagnosis present

## 2018-08-14 NOTE — H&P (Signed)
TOTAL KNEE ADMISSION H&P  Patient is being admitted for left total knee arthroplasty.  Subjective:  Chief Complaint:left knee pain.  HPI: Sara Diaz, 72 y.o. female, has a history of pain and functional disability in the left knee due to arthritis and has failed non-surgical conservative treatments for greater than 12 weeks to includeNSAID's and/or analgesics, corticosteriod injections, viscosupplementation injections, flexibility and strengthening excercises, supervised PT with diminished ADL's post treatment, use of assistive devices, weight reduction as appropriate and activity modification.  Onset of symptoms was gradual, starting 10 years ago with gradually worsening course since that time. The patient noted prior procedures on the knee to include  arthroscopy and menisectomy on the left knee(s).  Patient currently rates pain in the left knee(s) at 10 out of 10 with activity. Patient has night pain, worsening of pain with activity and weight bearing, pain that interferes with activities of daily living, crepitus and joint swelling.  Patient has evidence of subchondral sclerosis, periarticular osteophytes and joint space narrowing by imaging studies. There is no active infection.  Patient Active Problem List   Diagnosis Date Noted  . Type II diabetes mellitus ([place])   . RA (rheumatoid arthritis) ([place])   . Pulmonary embolism ([place])   . PONV (postoperative nausea and vomiting)   . Neuromuscular disorder ([place])   . Hypothyroidism   . Hypertension   . Depression   . Complication of anesthesia nausea and vomiting   . Barrett esophagus   . C5 vertebral fracture ([place]) 06/01/2017  . Cervical spinal stenosis 05/17/2017  . DVT (deep vein thrombosis) in pregnancy 10/17/1971   Past Medical History:  Diagnosis Date  . Arthritis    "neck ,hands, back" (05/17/2017)  . Barrett esophagus   . Complication of anesthesia nausea and vomiting   . Depression   . DVT (deep vein thrombosis) in pregnancy  1973   BLE; "I had a 10# pregnancy"  . Dyspnea    with exertion  . GERD (gastroesophageal reflux disease)    off prilosec .developed cough .thought to be allergic  to prilosec  . Headache(784.0)    "stopped having them daily after back OR in 2012; restarted in 2017; had 1-2/month" (05/17/2017)  . High cholesterol   . History of kidney stones 2006,2007  . Hypertension   . Hypothyroidism   . Neuromuscular disorder ([facility])    weakness r leg.  . Osteoporosis   . Pneumonia 2015-2016 X 2  . PONV (postoperative nausea and vomiting)   . Pulmonary embolism ([place]) 1973 X 2  . RA (rheumatoid arthritis) ([place])   . Type II diabetes mellitus ([place])    Type II    Past Surgical History:  Procedure Laterality Date  . ABDOMINAL ADHESION SURGERY  1992  . ABDOMINAL HYSTERECTOMY  1990  . ANTERIOR CERVICAL DECOMP/DISCECTOMY FUSION  05/17/2017  . ANTERIOR CERVICAL DECOMP/DISCECTOMY FUSION N/A 05/17/2017   Procedure: Anterior Cervical Decompression/Discectomy Fusion - Cervical four-Cervical five - Cervical five-Cervical six;  Surgeon: Earnie Larsson, MD;  Location: [facility];  Service: Neurosurgery;  Laterality: N/A;  . ANTERIOR CERVICAL DECOMP/DISCECTOMY FUSION N/A 06/01/2017   Procedure: Revision on Anterior Cervical Decompression/Discectomy  Cervical four-six;  Surgeon: Earnie Larsson, MD;  Location: [facility];  Service: Neurosurgery;  Laterality: N/A;  . APPENDECTOMY    . BACK SURGERY    . BREAST SURGERY Right 1968   2 tumors on breast -benign  . BREAST SURGERY Right 1980s   1 tumor on breast -benign  . CHOLECYSTECTOMY OPEN    .  CYST EXCISION Left 2001   index finger  . CYSTOSCOPY W/ STONE MANIPULATION  2006-2007 X 3  . DILATION AND CURETTAGE OF UTERUS  1984  . ENTEROCELE REPAIR  2007  . ESOPHAGOGASTRODUODENOSCOPY (EGD) WITH ESOPHAGEAL DILATION  4-5 Xs  . EYE SURGERY    . INCONTINENCE SURGERY  1991; 1993  . JOINT REPLACEMENT    . JOINT REPLACEMENT Left 2017   thumb; "replaced joint w/tendon"  . KNEE ARTHROSCOPY  Right   . LAMINECTOMY  09/04/2011   Procedure: LUMBAR LAMINECTOMY FOR TUMOR;  Surgeon: Cooper Render Pool;  Location: [facility];  Service: Neurosurgery;  Laterality: N/A;  Lumbar five laminectomy for resection of intradural tumor  . REFRACTIVE SURGERY Bilateral   . TENDON REPAIR Left 2017   ruptured tendon on thumb 2 wk after joint replaced w/tendon    No current facility-administered medications for this encounter.    Current Outpatient Medications  Medication Sig Dispense Refill Last Dose  . Abatacept (ORENCIA) 125 MG/ML SOSY Inject 125 mg into the skin every 7 (seven) days. Saturdays   Past Week at Unknown time  . amitriptyline (ELAVIL) 50 MG tablet Take 50 mg by mouth at bedtime.     08/13/2018 at Unknown time  . aspirin-acetaminophen-caffeine (EXCEDRIN MIGRAINE) 250-250-65 MG tablet Take 2 tablets by mouth daily as needed for headache.    08/13/2018 at Unknown time  . Cholecalciferol (HM VITAMIN D3) 4000 UNITS CAPS Take 4,000 Units by mouth daily.     08/14/2018 at Unknown time  . Dulaglutide (TRULICITY) 1.5 ZM/6.2HU SOPN Inject 1.5 mg into the skin every 7 (seven) days. Sunday   Past Week at Unknown time  . estradiol (VIVELLE-DOT) 0.05 MG/24HR patch Place 1 patch onto the skin 2 (two) times a week.   08/14/2018 at Unknown time  . folic acid (FOLVITE) 800 MCG tablet Take 1,600 mcg by mouth daily.   08/14/2018 at Unknown time  . ibuprofen (ADVIL,MOTRIN) 200 MG tablet Take 400 mg by mouth every 8 (eight) hours as needed for mild pain.   Past Month at Unknown time  . Insulin Detemir (LEVEMIR FLEXPEN) 100 UNIT/ML Pen Inject 30 Units into the skin every morning.    08/14/2018 at Unknown time  . levothyroxine (SYNTHROID, LEVOTHROID) 112 MCG tablet Take 112 mcg by mouth at bedtime.    08/14/2018 at Unknown time  . methotrexate (RHEUMATREX) 2.5 MG tablet Take 17.5 mg by mouth every 7 (seven) days. Monday   Past Week at Unknown time  . nadolol (CORGARD) 80 MG tablet Take 80 mg by mouth every evening.     10 /29/2019 at Unknown time  . omeprazole (PRILOSEC) 40 MG capsule Take 40 mg by mouth 2 (two) times daily.   08/14/2018 at Unknown time  . Polyethyl Glyc-Propyl Glyc PF (SYSTANE ULTRA PF) 0.4-0.3 % SOLN Place 1 drop into both eyes 4 (four) times daily as needed.   08/14/2018 at Unknown time  . pravastatin (PRAVACHOL) 40 MG tablet Take 60 mg by mouth at bedtime. Takes 1.5 tablet   08/13/2018 at Unknown time  . imiquimod (ALDARA) 5 % cream Apply 1 application topically 3 (three) times a week. As needed   More than a month at Unknown time  . methylPREDNISolone (MEDROL DOSEPAK) 4 MG TBPK tablet Take 6 tablets on day 1 then decrease by 1 tablet daily until finished.  Take at the first sign of a rheumatoid arthritis flare   More than a month at Unknown time  . zoledronic acid (RECLAST) 5  MG/100ML SOLN injection Inject 5 mg into the vein once. Aug 2019   More than a month at Unknown time   Allergies  Allergen Reactions  . Etanercept Other (See Comments)    Excessive sweating  . Sulfamethoxazole-Trimethoprim Itching  . Lisinopril Other (See Comments)    headaches  . Nitrofurantoin Rash    'has red dye in it'  . Canagliflozin Other (See Comments)    Constant yeast infection  . Contrast Media [Iodinated Diagnostic Agents] Other (See Comments)    Convulsions,seizures   . Fluoxetine Other (See Comments)    Depression  . Ioxaglate Other (See Comments)    Convulsions  . Nabumetone Other (See Comments)    unknown  . Tramadol Nausea And Vomiting  . Adalimumab Rash  . Metformin Other (See Comments)    GI upset  . Olmesartan Nausea And Vomiting  . Penicillins Rash    Has patient had a PCN reaction causing immediate rash, facial/tongue/throat swelling, SOB or lightheadedness with hypotension: Unknown Has patient had a PCN reaction causing severe rash involving mucus membranes or skin necrosis: Unknown Has patient had a PCN reaction that required hospitalization: Unknown Has patient had a PCN  reaction occurring within the last 10 years: No If all of the above answers are "NO", then may proceed with Cephalosporin use.   . Red Dye Rash    Rash in the face    Social History   Tobacco Use  . Smoking status: Never Smoker  . Smokeless tobacco: Never Used  Substance Use Topics  . Alcohol use: No    No family history on file.   Review of Systems  Constitutional: Negative.   HENT: Negative.   Eyes: Negative.   Respiratory: Negative.   Cardiovascular: Negative.   Gastrointestinal: Negative.   Genitourinary: Negative.   Musculoskeletal: Positive for back pain and joint pain.  Skin: Negative.   Neurological: Negative.   Endo/Heme/Allergies: Negative.   Psychiatric/Behavioral: Negative.     Objective:  Physical Exam  Constitutional: She is oriented to person, place, and time. She appears well-developed and well-nourished.  HENT:  Head: Normocephalic.  Eyes: Pupils are equal, round, and reactive to light. Conjunctivae are normal.  Neck: Neck supple.  Cardiovascular: Normal rate.  Respiratory: Effort normal and breath sounds normal.  GI: Soft. Bowel sounds are normal.  Genitourinary:  Genitourinary Comments: Not pertinent to current symptomatology therefore not examined.  Musculoskeletal:   Examination of her left knee reveals pain medially and laterally.  Pain over the pes bursa and anterior prepatellar bursa.  Range of motion 0-120 degrees.  Knee is stable with normal patella tracking.  Examination of her right knee reveals moderate pain.  1+ synovitis.  Range of motion 0-120 degrees.  Knee is stable with normal patella tracking.    Neurological: She is alert and oriented to person, place, and time.  Skin: Skin is warm and dry.  Psychiatric: She has a normal mood and affect. Her behavior is normal.    Vital signs in last 24 hours:    Labs:   Estimated body mass index is 25.52 kg/m as calculated from the following:   Height as of 06/13/17: 5\' 7"  (1.702 m).    Weight as of 06/12/18: 73.9 kg.   Imaging Review Plain radiographs demonstrate severe degenerative joint disease of the left knee(s). The overall alignment issignificant varus. The bone quality appears to be good for age and reported activity level.   Preoperative templating of the joint replacement has been completed, documented,  and submitted to the Operating Room personnel in order to optimize intra-operative equipment management.    Patient's anticipated LOS is less than 2 midnights, meeting these requirements: - Younger than 33 - Lives within 1 hour of care - Has a competent adult at home to recover with post-op recover - NO history of  - Chronic pain requiring opiods  - Diabetes  - Coronary Artery Disease  - Heart failure  - Heart attack  - Stroke  - DVT/VTE  - Cardiac arrhythmia  - Respiratory Failure/COPD  - Renal failure  - Anemia  - Advanced Liver disease        Assessment/Plan:  End stage arthritis, left knee   The patient history, physical examination, clinical judgment of the provider and imaging studies are consistent with end stage degenerative joint disease of the left knee(s) and total knee arthroplasty is deemed medically necessary. The treatment options including medical management, injection therapy arthroscopy and arthroplasty were discussed at length. The risks and benefits of total knee arthroplasty were presented and reviewed. The risks due to aseptic loosening, infection, stiffness, patella tracking problems, thromboembolic complications and other imponderables were discussed. The patient acknowledged the explanation, agreed to proceed with the plan and consent was signed. Patient is being admitted for inpatient treatment for surgery, pain control, PT, OT, prophylactic antibiotics, VTE prophylaxis, progressive ambulation and ADL's and discharge planning. The patient is planning to be discharged home with home health services   Post op will use eliquis  due to history of DVT and PE

## 2018-08-15 NOTE — Pre-Procedure Instructions (Signed)
AZUCENA DART  08/15/2018      [id number] - [place], [place] - [address] [place] [address] Phone: [phone] Fax: [phone]    Your procedure is scheduled on August 26, 2018.  Report to [facility] Admitting at 1050 AM.  Call this number if you have problems the morning of surgery:  [phone]   Remember:  Do not eat or drink after midnight.    Take these medicines the morning of surgery with A SIP OF WATER  Omeprazole (prilosec) Levothyroxine (synthroid) Eye drops-if needed  7 days prior to surgery STOP taking any Excedrin Migraine, Aspirin (unless otherwise instructed by your surgeon), Aleve, Naproxen, Ibuprofen, Motrin, Advil, Goody's, BC's, all herbal medications, fish oil, and all vitamins    WHAT DO I DO ABOUT MY DIABETES MEDICATION?  Marland Kitchen Do not take oral diabetes medicines (pills) the morning of surgery.    . THE MORNING OF SURGERY, take 15 units of Levimir insulin (1/2 of your normal dose).   . The day of surgery, do not take other diabetes injectables, including Trulicity (dulaglutide).  . If your CBG is greater than 220 mg/dL, you may take  of your sliding scale (correction) dose of insulin.  Reviewed and Endorsed by [facility] Patient Education Committee, August 2015 How to Manage Your Diabetes Before and After Surgery  Why is it important to control my blood sugar before and after surgery? . Improving blood sugar levels before and after surgery helps healing and can limit problems. . A way of improving blood sugar control is eating a healthy diet by: o  Eating less sugar and carbohydrates o  Increasing activity/exercise o  Talking with your doctor about reaching your blood sugar goals . High blood sugars (greater than 180 mg/dL) can raise your risk of infections and slow your recovery, so you will need to focus on controlling your diabetes during the weeks before surgery. . Make sure that the  doctor who takes care of your diabetes knows about your planned surgery including the date and location.  How do I manage my blood sugar before surgery? . Check your blood sugar at least 4 times a day, starting 2 days before surgery, to make sure that the level is not too high or low. o Check your blood sugar the morning of your surgery when you wake up and every 2 hours until you get to the Short Stay unit. . If your blood sugar is less than 70 mg/dL, you will need to treat for low blood sugar: o Do not take insulin. o Treat a low blood sugar (less than 70 mg/dL) with  cup of clear juice (cranberry or apple), 4 glucose tablets, OR glucose gel. Recheck blood sugar in 15 minutes after treatment (to make sure it is greater than 70 mg/dL). If your blood sugar is not greater than 70 mg/dL on recheck, call [phone] o  for further instructions. . Report your blood sugar to the short stay nurse when you get to Short Stay.  . If you are admitted to the hospital after surgery: o Your blood sugar will be checked by the staff and you will probably be given insulin after surgery (instead of oral diabetes medicines) to make sure you have good blood sugar levels. o The goal for blood sugar control after surgery is 80-180 mg/dL.  [facility]- Preparing For Surgery  Before surgery, you can play an important role. Because skin is not  sterile, your skin needs to be as free of germs as possible. You can reduce the number of germs on your skin by washing with CHG (chlorahexidine gluconate) Soap before surgery.  CHG is an antiseptic cleaner which kills germs and bonds with the skin to continue killing germs even after washing.    Oral Hygiene is also important to reduce your risk of infection.  Remember - BRUSH YOUR TEETH THE MORNING OF SURGERY WITH YOUR REGULAR TOOTHPASTE  Please do not use if you have an allergy to CHG or antibacterial soaps. If your skin becomes reddened/irritated stop using the CHG.  Do  not shave (including legs and underarms) for at least 48 hours prior to first CHG shower. It is OK to shave your face.  Please follow these instructions carefully.   1. Shower the NIGHT BEFORE SURGERY and the MORNING OF SURGERY with CHG.   2. If you chose to wash your hair, wash your hair first as usual with your normal shampoo.  3. After you shampoo, rinse your hair and body thoroughly to remove the shampoo.  4. Use CHG as you would any other liquid soap. You can apply CHG directly to the skin and wash gently with a scrungie or a clean washcloth.   5. Apply the CHG Soap to your body ONLY FROM THE NECK DOWN.  Do not use on open wounds or open sores. Avoid contact with your eyes, ears, mouth and genitals (private parts). Wash Face and genitals (private parts)  with your normal soap.  6. Wash thoroughly, paying special attention to the area where your surgery will be performed.  7. Thoroughly rinse your body with warm water from the neck down.  8. DO NOT shower/wash with your normal soap after using and rinsing off the CHG Soap.  9. Pat yourself dry with a CLEAN TOWEL.  10. Wear CLEAN PAJAMAS to bed the night before surgery, wear comfortable clothes the morning of surgery  11. Place CLEAN SHEETS on your bed the night of your first shower and DO NOT SLEEP WITH PETS.  Day of Surgery:  Do not apply any deodorants/lotions.  Please wear clean clothes to the hospital/surgery center.   Remember to brush your teeth WITH YOUR REGULAR TOOTHPASTE.   Do not wear jewelry, make-up or nail polish.  Do not wear lotions, powders, or perfumes, or deodorant.  Do not shave 48 hours prior to surgery.    Do not bring valuables to the hospital.  [facility] is not responsible for any belongings or valuables.  Contacts, dentures or bridgework may not be worn into surgery.  Leave your suitcase in the car.  After surgery it may be brought to your room.  For patients admitted to the hospital, discharge  time will be determined by your treatment team.  Patients discharged the day of surgery will not be allowed to drive home.   Please read over the  fact sheets that you were given.

## 2018-08-15 NOTE — Progress Notes (Addendum)
PCP: Thomes Dinning, MD  Cardiologist: Maple Hudson, MD  EKG: 03/2018 -tracing requested from Dr. Atilano Median at [facility]  Stress test: 07/15/18- care everywhere  ECHO: pt denies  Cardiac Cath: pt denies  Chest x-ray: pt denies past year, no recent respiratory infections/complications

## 2018-08-16 ENCOUNTER — Encounter (HOSPITAL_COMMUNITY)
Admission: RE | Admit: 2018-08-16 | Discharge: 2018-08-16 | Disposition: A | Discharge status: Home / Self Care | Payer: Medicare HMO | Source: Ambulatory Visit | Attending: Orthopedic Surgery | Admitting: Orthopedic Surgery

## 2018-08-16 ENCOUNTER — Encounter (HOSPITAL_COMMUNITY): Payer: Self-pay

## 2018-08-16 DIAGNOSIS — M1712 Unilateral primary osteoarthritis, left knee: Secondary | ICD-10-CM | POA: Diagnosis not present

## 2018-08-16 DIAGNOSIS — K219 Gastro-esophageal reflux disease without esophagitis: Secondary | ICD-10-CM | POA: Insufficient documentation

## 2018-08-16 DIAGNOSIS — Z79890 Hormone replacement therapy (postmenopausal): Secondary | ICD-10-CM | POA: Diagnosis not present

## 2018-08-16 DIAGNOSIS — Z79899 Other long term (current) drug therapy: Secondary | ICD-10-CM | POA: Diagnosis not present

## 2018-08-16 DIAGNOSIS — E119 Type 2 diabetes mellitus without complications: Secondary | ICD-10-CM | POA: Diagnosis not present

## 2018-08-16 DIAGNOSIS — E039 Hypothyroidism, unspecified: Secondary | ICD-10-CM | POA: Diagnosis not present

## 2018-08-16 DIAGNOSIS — I10 Essential (primary) hypertension: Secondary | ICD-10-CM | POA: Insufficient documentation

## 2018-08-16 DIAGNOSIS — Z86718 Personal history of other venous thrombosis and embolism: Secondary | ICD-10-CM | POA: Diagnosis not present

## 2018-08-16 DIAGNOSIS — M069 Rheumatoid arthritis, unspecified: Secondary | ICD-10-CM | POA: Diagnosis not present

## 2018-08-16 DIAGNOSIS — Z01812 Encounter for preprocedural laboratory examination: Secondary | ICD-10-CM | POA: Insufficient documentation

## 2018-08-16 DIAGNOSIS — Z794 Long term (current) use of insulin: Secondary | ICD-10-CM | POA: Diagnosis not present

## 2018-08-16 LAB — COMPREHENSIVE METABOLIC PANEL
ALT: 13 U/L (ref 0–44)
AST: 23 U/L (ref 15–41)
Albumin: 3.7 g/dL (ref 3.5–5.0)
Alkaline Phosphatase: 79 U/L (ref 38–126)
Anion gap: 4 — ABNORMAL LOW (ref 5–15)
BUN: 9 mg/dL (ref 8–23)
CALCIUM: 8.9 mg/dL (ref 8.9–10.3)
CHLORIDE: 109 mmol/L (ref 98–111)
CO2: 27 mmol/L (ref 22–32)
CREATININE: 0.94 mg/dL (ref 0.44–1.00)
GFR, EST NON AFRICAN AMERICAN: 59 mL/min — AB (ref >60)
GLUCOSE: 110 mg/dL — AB (ref 70–99)
Potassium: 4.1 mmol/L (ref 3.5–5.1)
SODIUM: 140 mmol/L (ref 135–145)
TOTAL PROTEIN: 6.3 g/dL — AB (ref 6.5–8.1)
Total Bilirubin: 1 mg/dL (ref 0.3–1.2)

## 2018-08-16 LAB — APTT: APTT: 28 s (ref 24–36)

## 2018-08-16 LAB — CBC WITH DIFFERENTIAL/PLATELET
Abs Immature Granulocytes: 0.01 10*3/uL (ref 0.00–0.07)
Basophils Absolute: 0 10*3/uL (ref 0.0–0.1)
Basophils Relative: 1 %
EOS ABS: 0.4 10*3/uL (ref 0.0–0.5)
Eosinophils Relative: 7 %
HCT: 42.5 % (ref 36.0–46.0)
Hemoglobin: 13.8 g/dL (ref 12.0–15.0)
IMMATURE GRANULOCYTES: 0 %
Lymphocytes Relative: 22 %
Lymphs Abs: 1.2 10*3/uL (ref 0.7–4.0)
MCH: 29.6 pg (ref 26.0–34.0)
MCHC: 32.5 g/dL (ref 30.0–36.0)
MCV: 91.2 fL (ref 80.0–100.0)
MONOS PCT: 8 %
Monocytes Absolute: 0.4 10*3/uL (ref 0.1–1.0)
NEUTROS PCT: 62 %
Neutro Abs: 3.3 10*3/uL (ref 1.7–7.7)
PLATELETS: 230 10*3/uL (ref 150–400)
RBC: 4.66 MIL/uL (ref 3.87–5.11)
RDW: 14.6 % (ref 11.5–15.5)
WBC: 5.3 10*3/uL (ref 4.0–10.5)
nRBC: 0 % (ref 0.0–0.2)

## 2018-08-16 LAB — TYPE AND SCREEN
ABO/RH(D): A POS
Antibody Screen: NEGATIVE

## 2018-08-16 LAB — SURGICAL PCR SCREEN
MRSA, PCR: NEGATIVE
Staphylococcus aureus: NEGATIVE

## 2018-08-16 LAB — PROTIME-INR
INR: 1.05
Prothrombin Time: 13.6 seconds (ref 11.4–15.2)

## 2018-08-16 LAB — HEMOGLOBIN A1C
Hgb A1c MFr Bld: 6.6 % — ABNORMAL HIGH (ref 4.8–5.6)
MEAN PLASMA GLUCOSE: 142.72 mg/dL

## 2018-08-16 LAB — GLUCOSE, CAPILLARY: Glucose-Capillary: 125 mg/dL — ABNORMAL HIGH (ref 70–99)

## 2018-08-17 LAB — URINE CULTURE

## 2018-08-19 ENCOUNTER — Other Ambulatory Visit: Payer: Self-pay | Admitting: Orthopedic Surgery

## 2018-08-19 NOTE — Care Plan (Signed)
Spoke with patient prior to surgery. Will discharge to home with family and HHPT. Referral to [facility]. Equipment ordered.   Ladell Heads, [place]

## 2018-08-19 NOTE — Anesthesia Preprocedure Evaluation (Addendum)
Anesthesia Evaluation  Patient identified by MRN, date of birth, ID band Patient awake    Reviewed: Allergy & Precautions, NPO status , Patient's Chart, lab work & pertinent test results, reviewed documented beta blocker date and time   History of Anesthesia Complications (+) PONV and history of anesthetic complications  Airway Mallampati: II  TM Distance: >3 FB Neck ROM: Full    Dental no notable dental hx. (+) Teeth Intact, Dental Advisory Given   Pulmonary neg pulmonary ROS,    Pulmonary exam normal breath sounds clear to auscultation       Cardiovascular hypertension, Pt. on home beta blockers and Pt. on medications + DVT  Normal cardiovascular exam Rhythm:Regular Rate:Normal  05/03/2017: NSR. Rate 61.  CV:  Myocardial perfusion stress 07/15/2018: Summary:  Both stress and rest images demonstrate normal uptake in all walls.  The calculated EF is 70%.  There is gut uptake noted on stress images.  There is no evidence of inducible ischemia on this study.   Neuro/Psych  Headaches, PSYCHIATRIC DISORDERS Depression    GI/Hepatic Neg liver ROS, GERD  ,  Endo/Other  diabetes, Type 2, Insulin DependentHypothyroidism   Renal/GU negative Renal ROS  negative genitourinary   Musculoskeletal  (+) Arthritis ,   Abdominal   Peds negative pediatric ROS (+)  Hematology negative hematology ROS (+)   Anesthesia Other Findings   Reproductive/Obstetrics negative OB ROS                            Anesthesia Physical Anesthesia Plan  ASA: III  Anesthesia Plan: Regional and Spinal   Post-op Pain Management:  Regional for Post-op pain   Induction: Intravenous  PONV Risk Score and Plan: 3 and Midazolam, Dexamethasone and Ondansetron  Airway Management Planned: Natural Airway and Simple Face Mask  Additional Equipment:   Intra-op Plan:   Post-operative Plan:   Informed Consent: I have  reviewed the patients History and Physical, chart, labs and discussed the procedure including the risks, benefits and alternatives for the proposed anesthesia with the patient or authorized representative who has indicated his/her understanding and acceptance.   Dental advisory given  Plan Discussed with: CRNA  Anesthesia Plan Comments: (See PAT note 08/19/2018 by Karoline Caldwell, PA-C )       Anesthesia Quick Evaluation

## 2018-08-19 NOTE — Progress Notes (Signed)
Anesthesia Chart Review:  Case:  [id number] Date/Time:  08/26/18 1240   Procedure:  TOTAL KNEE ARTHROPLASTY (Left )   Anesthesia type:  Spinal   Pre-op diagnosis:  djd left knee   Location:  [facility] OR ROOM 07 / [facility]   Surgeon:  Elsie Saas, MD      DISCUSSION: 72 yo female never smoker. Pertinent hx includes PONV, GERD with Barrett's esophagus, Remote hx of DVT and PE, IDDMII, Rheumatoid arthritis, DOE, Hypothyroid, Right leg weakness.  She has medical clearance from Tereasa Coop, PA-C 07/19/2018 in care everywhere.  She also had a preoperative cardiac exam with negative myocardial perfusion stress test. EF 70%, no evidence of inducible ischemia.  Anticipate she can proceed as planned barring acute status change  VS: BP 139/86 Comment: manually recheck at the end of PAT visit by E. Toy Cookey, RN  Pulse 70   Temp 36.5 C   Resp 20   Ht 5\' 7"  (1.702 m)   Wt 76.8 kg   SpO2 97%   BMI 26.52 kg/m   PROVIDERS: Thomes Dinning, MD is PCP  Florene Glen, MD is Rheumatologist  Gabrielle Dare, MD is Cardiologist  LABS: Labs reviewed: Acceptable for surgery. (all labs ordered are listed, but only abnormal results are displayed)  Labs Reviewed  URINE CULTURE - Abnormal; Notable for the following components:      Result Value   Culture   (*)    Value: <10,000 COLONIES/mL INSIGNIFICANT GROWTH Performed at [facility], [address] [place], [place] [address]    All other components within normal limits  GLUCOSE, CAPILLARY - Abnormal; Notable for the following components:   Glucose-Capillary 125 (*)    All other components within normal limits  HEMOGLOBIN A1C - Abnormal; Notable for the following components:   Hgb A1c MFr Bld 6.6 (*)    All other components within normal limits  COMPREHENSIVE METABOLIC PANEL - Abnormal; Notable for the following components:   Glucose, Bld 110 (*)    Total Protein 6.3 (*)    GFR calc non Af Amer 59 (*)    Anion gap 4 (*)    All other  components within normal limits  SURGICAL PCR SCREEN  APTT  CBC WITH DIFFERENTIAL/PLATELET  PROTIME-INR  TYPE AND SCREEN    IMAGES: CHEST2 VIEW 08/07/2018  COMPARISON:December 15, 2016  FINDINGS: There is no edema or consolidation. The heart size and pulmonary vascularity are normal. No pneumothorax. No adenopathy. There is postoperative change in the lower cervical region.  IMPRESSION: No edema or consolidation.  EKG: 03/26/2018 (care everywhere, narrative only, requested): NSR. Rate 70.  05/03/2017: NSR. Rate 61.  CV:  Myocardial perfusion stress 07/15/2018: Summary:  Both stress and rest images demonstrate normal uptake in all walls.  The calculated EF is 70%.  There is gut uptake noted on stress images.  There is no evidence of inducible ischemia on this study.  Past Medical History:  Diagnosis Date  . Arthritis    "neck ,hands, back" (05/17/2017)  . Barrett esophagus   . Complication of anesthesia nausea and vomiting   . Depression   . DVT (deep vein thrombosis) in pregnancy 1973   BLE; "I had a 10# pregnancy"  . Dyspnea    with exertion  . GERD (gastroesophageal reflux disease)    off prilosec .developed cough .thought to be allergic  to prilosec  . Headache(784.0)    "stopped having them daily after back OR in 2012; restarted in 2017; had 1-2/month" (  05/17/2017)  . High cholesterol   . History of kidney stones 2006,2007  . Hypertension   . Hypothyroidism   . Neuromuscular disorder (HCC)    weakness r leg.  . Osteoporosis   . Pneumonia 2015-2016 X 2  . PONV (postoperative nausea and vomiting)   . Pulmonary embolism ([place]) 1973 X 2  . RA (rheumatoid arthritis) ([place])   . Type II diabetes mellitus ([place])    Type II    Past Surgical History:  Procedure Laterality Date  . ABDOMINAL ADHESION SURGERY  1992  . ABDOMINAL HYSTERECTOMY  1990  . ANTERIOR CERVICAL DECOMP/DISCECTOMY FUSION  05/17/2017  . ANTERIOR CERVICAL DECOMP/DISCECTOMY FUSION N/A 05/17/2017    Procedure: Anterior Cervical Decompression/Discectomy Fusion - Cervical four-Cervical five - Cervical five-Cervical six;  Surgeon: Earnie Larsson, MD;  Location: [place];  Service: Neurosurgery;  Laterality: N/A;  . ANTERIOR CERVICAL DECOMP/DISCECTOMY FUSION N/A 06/01/2017   Procedure: Revision on Anterior Cervical Decompression/Discectomy  Cervical four-six;  Surgeon: Earnie Larsson, MD;  Location: [place];  Service: Neurosurgery;  Laterality: N/A;  . APPENDECTOMY    . BACK SURGERY    . BREAST SURGERY Right 1968   2 tumors on breast -benign  . BREAST SURGERY Right 1980s   1 tumor on breast -benign  . CHOLECYSTECTOMY OPEN    . CYST EXCISION Left 2001   index finger  . CYSTOSCOPY W/ STONE MANIPULATION  2006-2007 X 3  . DILATION AND CURETTAGE OF UTERUS  1984  . ENTEROCELE REPAIR  2007  . ESOPHAGOGASTRODUODENOSCOPY (EGD) WITH ESOPHAGEAL DILATION  4-5 Xs  . EYE SURGERY    . INCONTINENCE SURGERY  1991; 1993  . JOINT REPLACEMENT    . JOINT REPLACEMENT Left 2017   thumb; "replaced joint w/tendon"  . KNEE ARTHROSCOPY Right   . LAMINECTOMY  09/04/2011   Procedure: LUMBAR LAMINECTOMY FOR TUMOR;  Surgeon: Cooper Render Pool;  Location: [place] [facility];  Service: Neurosurgery;  Laterality: N/A;  Lumbar five laminectomy for resection of intradural tumor  . REFRACTIVE SURGERY Bilateral   . TENDON REPAIR Left 2017   ruptured tendon on thumb 2 wk after joint replaced w/tendon    MEDICATIONS: . Abatacept (ORENCIA) 125 MG/ML SOSY  . amitriptyline (ELAVIL) 50 MG tablet  . aspirin-acetaminophen-caffeine (EXCEDRIN MIGRAINE) 250-250-65 MG tablet  . Cholecalciferol (HM VITAMIN D3) 4000 UNITS CAPS  . Dulaglutide (TRULICITY) 1.5 GU/5.4YH SOPN  . estradiol (VIVELLE-DOT) 0.05 MG/24HR patch  . folic acid (FOLVITE) 062 MCG tablet  . ibuprofen (ADVIL,MOTRIN) 200 MG tablet  . imiquimod (ALDARA) 5 % cream  . Insulin Detemir (LEVEMIR FLEXPEN) 100 UNIT/ML Pen  . levothyroxine (SYNTHROID, LEVOTHROID) 112 MCG tablet  .  methotrexate (RHEUMATREX) 2.5 MG tablet  . methylPREDNISolone (MEDROL DOSEPAK) 4 MG TBPK tablet  . nadolol (CORGARD) 80 MG tablet  . omeprazole (PRILOSEC) 40 MG capsule  . Polyethyl Glyc-Propyl Glyc PF (SYSTANE ULTRA PF) 0.4-0.3 % SOLN  . pravastatin (PRAVACHOL) 40 MG tablet  . zoledronic acid (RECLAST) 5 MG/100ML SOLN injection   No current facility-administered medications for this encounter.      Wynonia Musty [facility] Short Stay Center/Anesthesiology Phone [phone] 08/19/2018 1:43 PM

## 2018-08-23 ENCOUNTER — Other Ambulatory Visit: Payer: Self-pay | Admitting: Orthopedic Surgery

## 2018-08-23 MED ORDER — BUPIVACAINE LIPOSOME 1.3 % IJ SUSP
20.0000 mL | INTRAMUSCULAR | Status: DC
  Filled 2018-08-23: qty 20

## 2018-08-23 MED ORDER — TRANEXAMIC ACID-NACL 1000-0.7 MG/100ML-% IV SOLN
1000.0000 mg | INTRAVENOUS | Status: AC
  Administered 2018-08-26: 1000 mg via INTRAVENOUS
  Filled 2018-08-23: qty 100

## 2018-08-23 NOTE — Care Plan (Signed)
Met with patient in the office. Will discharge to home with family and HHPT. Equipment ordered.   Sara Diaz, Evan

## 2018-08-26 ENCOUNTER — Inpatient Hospital Stay (HOSPITAL_COMMUNITY): Payer: Medicare HMO | Admitting: Certified Registered"

## 2018-08-26 ENCOUNTER — Encounter (HOSPITAL_COMMUNITY): Payer: Self-pay | Admitting: Certified Registered"

## 2018-08-26 ENCOUNTER — Inpatient Hospital Stay (HOSPITAL_COMMUNITY): Payer: Medicare HMO | Admitting: Physician Assistant

## 2018-08-26 ENCOUNTER — Encounter (HOSPITAL_COMMUNITY)
Admission: RE | Disposition: A | Discharge status: Home Health | Payer: Self-pay | Source: Home / Self Care | Attending: Orthopedic Surgery

## 2018-08-26 ENCOUNTER — Other Ambulatory Visit: Payer: Self-pay

## 2018-08-26 ENCOUNTER — Inpatient Hospital Stay (HOSPITAL_COMMUNITY)
Admission: RE | Admit: 2018-08-26 | Discharge: 2018-08-28 | DRG: 470 | Disposition: A | Discharge status: Home Health | Payer: Medicare HMO | Attending: Orthopedic Surgery | Admitting: Orthopedic Surgery

## 2018-08-26 DIAGNOSIS — E7800 Pure hypercholesterolemia, unspecified: Secondary | ICD-10-CM | POA: Diagnosis not present

## 2018-08-26 DIAGNOSIS — Z888 Allergy status to other drugs, medicaments and biological substances status: Secondary | ICD-10-CM

## 2018-08-26 DIAGNOSIS — I10 Essential (primary) hypertension: Secondary | ICD-10-CM | POA: Diagnosis present

## 2018-08-26 DIAGNOSIS — Z880 Allergy status to penicillin: Secondary | ICD-10-CM

## 2018-08-26 DIAGNOSIS — E119 Type 2 diabetes mellitus without complications: Secondary | ICD-10-CM | POA: Diagnosis not present

## 2018-08-26 DIAGNOSIS — Z882 Allergy status to sulfonamides status: Secondary | ICD-10-CM

## 2018-08-26 DIAGNOSIS — Z794 Long term (current) use of insulin: Secondary | ICD-10-CM

## 2018-08-26 DIAGNOSIS — I2699 Other pulmonary embolism without acute cor pulmonale: Secondary | ICD-10-CM | POA: Diagnosis present

## 2018-08-26 DIAGNOSIS — F329 Major depressive disorder, single episode, unspecified: Secondary | ICD-10-CM | POA: Diagnosis not present

## 2018-08-26 DIAGNOSIS — Z79890 Hormone replacement therapy (postmenopausal): Secondary | ICD-10-CM

## 2018-08-26 DIAGNOSIS — R112 Nausea with vomiting, unspecified: Secondary | ICD-10-CM | POA: Diagnosis not present

## 2018-08-26 DIAGNOSIS — T8859XA Other complications of anesthesia, initial encounter: Secondary | ICD-10-CM | POA: Diagnosis present

## 2018-08-26 DIAGNOSIS — Z7982 Long term (current) use of aspirin: Secondary | ICD-10-CM | POA: Diagnosis not present

## 2018-08-26 DIAGNOSIS — Z91041 Radiographic dye allergy status: Secondary | ICD-10-CM

## 2018-08-26 DIAGNOSIS — E039 Hypothyroidism, unspecified: Secondary | ICD-10-CM | POA: Diagnosis not present

## 2018-08-26 DIAGNOSIS — T4145XA Adverse effect of unspecified anesthetic, initial encounter: Secondary | ICD-10-CM | POA: Diagnosis present

## 2018-08-26 DIAGNOSIS — Z87442 Personal history of urinary calculi: Secondary | ICD-10-CM | POA: Diagnosis not present

## 2018-08-26 DIAGNOSIS — Z96692 Finger-joint replacement of left hand: Secondary | ICD-10-CM | POA: Diagnosis present

## 2018-08-26 DIAGNOSIS — M1712 Unilateral primary osteoarthritis, left knee: Secondary | ICD-10-CM | POA: Diagnosis not present

## 2018-08-26 DIAGNOSIS — Z79899 Other long term (current) drug therapy: Secondary | ICD-10-CM

## 2018-08-26 DIAGNOSIS — O2230 Deep phlebothrombosis in pregnancy, unspecified trimester: Secondary | ICD-10-CM | POA: Diagnosis present

## 2018-08-26 DIAGNOSIS — K219 Gastro-esophageal reflux disease without esophagitis: Secondary | ICD-10-CM | POA: Diagnosis present

## 2018-08-26 DIAGNOSIS — K2270 Barrett's esophagus without dysplasia: Secondary | ICD-10-CM | POA: Diagnosis not present

## 2018-08-26 DIAGNOSIS — Z91018 Allergy to other foods: Secondary | ICD-10-CM | POA: Diagnosis not present

## 2018-08-26 DIAGNOSIS — F32A Depression, unspecified: Secondary | ICD-10-CM | POA: Diagnosis present

## 2018-08-26 DIAGNOSIS — M810 Age-related osteoporosis without current pathological fracture: Secondary | ICD-10-CM | POA: Diagnosis present

## 2018-08-26 DIAGNOSIS — M4802 Spinal stenosis, cervical region: Secondary | ICD-10-CM | POA: Diagnosis not present

## 2018-08-26 DIAGNOSIS — M25562 Pain in left knee: Secondary | ICD-10-CM | POA: Diagnosis present

## 2018-08-26 DIAGNOSIS — M069 Rheumatoid arthritis, unspecified: Secondary | ICD-10-CM | POA: Diagnosis present

## 2018-08-26 DIAGNOSIS — Z86711 Personal history of pulmonary embolism: Secondary | ICD-10-CM

## 2018-08-26 DIAGNOSIS — G709 Myoneural disorder, unspecified: Secondary | ICD-10-CM | POA: Diagnosis present

## 2018-08-26 DIAGNOSIS — Z9889 Other specified postprocedural states: Secondary | ICD-10-CM

## 2018-08-26 HISTORY — PX: TOTAL KNEE ARTHROPLASTY: SHX125

## 2018-08-26 LAB — GLUCOSE, CAPILLARY
GLUCOSE-CAPILLARY: 87 mg/dL (ref 70–99)
Glucose-Capillary: 136 mg/dL — ABNORMAL HIGH (ref 70–99)

## 2018-08-26 SURGERY — ARTHROPLASTY, KNEE, TOTAL
Anesthesia: Regional | Site: Knee | Laterality: Left

## 2018-08-26 MED ORDER — 0.9 % SODIUM CHLORIDE (POUR BTL) OPTIME
TOPICAL | Status: DC | PRN
  Administered 2018-08-26: 1000 mL

## 2018-08-26 MED ORDER — ALUM & MAG HYDROXIDE-SIMETH 200-200-20 MG/5ML PO SUSP: 30.0000 mL | ORAL | Status: DC | PRN

## 2018-08-26 MED ORDER — BUPIVACAINE-EPINEPHRINE 0.5% -1:200000 IJ SOLN
INTRAMUSCULAR | Status: DC | PRN
  Administered 2018-08-26: 50 mL

## 2018-08-26 MED ORDER — BUPIVACAINE IN DEXTROSE 0.75-8.25 % IT SOLN
INTRATHECAL | Status: DC | PRN
  Administered 2018-08-26: 1.6 mL via INTRATHECAL

## 2018-08-26 MED ORDER — POLYVINYL ALCOHOL 1.4 % OP SOLN
1.0000 [drp] | Freq: Four times a day (QID) | OPHTHALMIC | Status: DC | PRN
  Filled 2018-08-26: qty 15

## 2018-08-26 MED ORDER — CHLORHEXIDINE GLUCONATE 4 % EX LIQD: 60.0000 mL | Freq: Once | CUTANEOUS | Status: DC

## 2018-08-26 MED ORDER — ASPIRIN EC 325 MG PO TBEC
325.0000 mg | DELAYED_RELEASE_TABLET | Freq: Every day | ORAL | Status: DC
  Administered 2018-08-27: 325 mg via ORAL
  Filled 2018-08-26: qty 1

## 2018-08-26 MED ORDER — ROPIVACAINE HCL 7.5 MG/ML IJ SOLN
INTRAMUSCULAR | Status: DC | PRN
  Administered 2018-08-26: 20 mL via PERINEURAL

## 2018-08-26 MED ORDER — ONDANSETRON HCL 4 MG PO TABS: 4.0000 mg | ORAL_TABLET | Freq: Four times a day (QID) | ORAL | Status: DC | PRN

## 2018-08-26 MED ORDER — FOLIC ACID 800 MCG PO TABS: 1600.0000 ug | ORAL_TABLET | Freq: Every day | ORAL | Status: DC

## 2018-08-26 MED ORDER — SCOPOLAMINE 1 MG/3DAYS TD PT72
MEDICATED_PATCH | TRANSDERMAL | Status: DC | PRN
  Administered 2018-08-26: 1 via TRANSDERMAL

## 2018-08-26 MED ORDER — FENTANYL CITRATE (PF) 100 MCG/2ML IJ SOLN
INTRAMUSCULAR | Status: AC
  Administered 2018-08-26: 100 ug
  Filled 2018-08-26: qty 2

## 2018-08-26 MED ORDER — CEFUROXIME SODIUM 1.5 G IV SOLR
INTRAVENOUS | Status: AC
  Filled 2018-08-26: qty 1.5

## 2018-08-26 MED ORDER — POLYETHYL GLYC-PROPYL GLYC PF 0.4-0.3 % OP SOLN: 1.0000 [drp] | Freq: Four times a day (QID) | OPHTHALMIC | Status: DC | PRN

## 2018-08-26 MED ORDER — CEFAZOLIN SODIUM-DEXTROSE 2-4 GM/100ML-% IV SOLN
INTRAVENOUS | Status: AC
  Filled 2018-08-26: qty 100

## 2018-08-26 MED ORDER — FENTANYL CITRATE (PF) 250 MCG/5ML IJ SOLN
INTRAMUSCULAR | Status: DC | PRN
  Administered 2018-08-26: 50 ug via INTRAVENOUS

## 2018-08-26 MED ORDER — LACTATED RINGERS IV SOLN
INTRAVENOUS | Status: DC
  Administered 2018-08-26: 10:00:00 via INTRAVENOUS

## 2018-08-26 MED ORDER — FENTANYL CITRATE (PF) 100 MCG/2ML IJ SOLN
25.0000 ug | INTRAMUSCULAR | Status: DC | PRN
  Administered 2018-08-26: 50 ug via INTRAVENOUS
  Administered 2018-08-26 (×2): 25 ug via INTRAVENOUS

## 2018-08-26 MED ORDER — CEFAZOLIN SODIUM-DEXTROSE 2-4 GM/100ML-% IV SOLN
2.0000 g | INTRAVENOUS | Status: AC
  Administered 2018-08-26: 2 g via INTRAVENOUS

## 2018-08-26 MED ORDER — DEXMEDETOMIDINE HCL IN NACL 200 MCG/50ML IV SOLN
INTRAVENOUS | Status: AC
  Filled 2018-08-26: qty 50

## 2018-08-26 MED ORDER — MENTHOL 3 MG MT LOZG: 1.0000 | LOZENGE | OROMUCOSAL | Status: DC | PRN

## 2018-08-26 MED ORDER — DEXAMETHASONE SODIUM PHOSPHATE 10 MG/ML IJ SOLN
INTRAMUSCULAR | Status: DC | PRN
  Administered 2018-08-26: 10 mg via INTRAVENOUS

## 2018-08-26 MED ORDER — PHENOL 1.4 % MT LIQD: 1.0000 | OROMUCOSAL | Status: DC | PRN

## 2018-08-26 MED ORDER — BUPIVACAINE LIPOSOME 1.3 % IJ SUSP
INTRAMUSCULAR | Status: DC | PRN
  Administered 2018-08-26: 20 mL

## 2018-08-26 MED ORDER — DOCUSATE SODIUM 100 MG PO CAPS
100.0000 mg | ORAL_CAPSULE | Freq: Two times a day (BID) | ORAL | Status: DC
  Administered 2018-08-27 – 2018-08-28 (×2): 100 mg via ORAL
  Filled 2018-08-26 (×4): qty 1

## 2018-08-26 MED ORDER — VITAMIN D 25 MCG (1000 UNIT) PO TABS
4000.0000 [IU] | ORAL_TABLET | Freq: Every day | ORAL | Status: DC
  Administered 2018-08-27 – 2018-08-28 (×2): 4000 [IU] via ORAL

## 2018-08-26 MED ORDER — FENTANYL CITRATE (PF) 250 MCG/5ML IJ SOLN
INTRAMUSCULAR | Status: AC
  Filled 2018-08-26: qty 5

## 2018-08-26 MED ORDER — METOCLOPRAMIDE HCL 5 MG/ML IJ SOLN: 5.0000 mg | Freq: Three times a day (TID) | INTRAMUSCULAR | Status: DC | PRN

## 2018-08-26 MED ORDER — DEXAMETHASONE SODIUM PHOSPHATE 10 MG/ML IJ SOLN
10.0000 mg | Freq: Three times a day (TID) | INTRAMUSCULAR | Status: AC
  Administered 2018-08-26 – 2018-08-27 (×4): 10 mg via INTRAVENOUS
  Filled 2018-08-26 (×4): qty 1

## 2018-08-26 MED ORDER — ONDANSETRON HCL 4 MG/2ML IJ SOLN
INTRAMUSCULAR | Status: AC
  Administered 2018-08-26: 4 mg
  Filled 2018-08-26: qty 2

## 2018-08-26 MED ORDER — FOLIC ACID 1 MG PO TABS
1.0000 mg | ORAL_TABLET | Freq: Every day | ORAL | Status: DC
  Administered 2018-08-27 – 2018-08-28 (×2): 1 mg via ORAL
  Filled 2018-08-26 (×3): qty 1

## 2018-08-26 MED ORDER — LEVOTHYROXINE SODIUM 112 MCG PO TABS
112.0000 ug | ORAL_TABLET | Freq: Every day | ORAL | Status: DC
  Administered 2018-08-26 – 2018-08-27 (×2): 112 ug via ORAL
  Filled 2018-08-26 (×2): qty 1

## 2018-08-26 MED ORDER — BUPIVACAINE HCL 0.5 % IJ SOLN
INTRAMUSCULAR | Status: AC
  Filled 2018-08-26: qty 1

## 2018-08-26 MED ORDER — GABAPENTIN 300 MG PO CAPS
300.0000 mg | ORAL_CAPSULE | Freq: Every day | ORAL | Status: DC
  Administered 2018-08-26 – 2018-08-27 (×2): 300 mg via ORAL
  Filled 2018-08-26 (×2): qty 1

## 2018-08-26 MED ORDER — SODIUM CHLORIDE 0.9 % IV SOLN
INTRAVENOUS | Status: DC | PRN
  Administered 2018-08-26: 30 ug/min via INTRAVENOUS

## 2018-08-26 MED ORDER — POLYETHYLENE GLYCOL 3350 17 G PO PACK
17.0000 g | PACK | Freq: Two times a day (BID) | ORAL | Status: DC
  Filled 2018-08-26 (×5): qty 1

## 2018-08-26 MED ORDER — INSULIN DETEMIR 100 UNIT/ML ~~LOC~~ SOLN
30.0000 [IU] | SUBCUTANEOUS | Status: DC
  Administered 2018-08-27 – 2018-08-28 (×2): 30 [IU] via SUBCUTANEOUS
  Filled 2018-08-26 (×2): qty 0.3

## 2018-08-26 MED ORDER — PRAVASTATIN SODIUM 40 MG PO TABS
60.0000 mg | ORAL_TABLET | Freq: Every day | ORAL | Status: DC
  Administered 2018-08-26 – 2018-08-27 (×2): 60 mg via ORAL
  Filled 2018-08-26 (×2): qty 1

## 2018-08-26 MED ORDER — METOCLOPRAMIDE HCL 5 MG PO TABS: 5.0000 mg | ORAL_TABLET | Freq: Three times a day (TID) | ORAL | Status: DC | PRN

## 2018-08-26 MED ORDER — HYPROMELLOSE (GONIOSCOPIC) 2.5 % OP SOLN
1.0000 [drp] | Freq: Four times a day (QID) | OPHTHALMIC | Status: DC | PRN
  Filled 2018-08-26: qty 15

## 2018-08-26 MED ORDER — SODIUM CHLORIDE 0.9 % IR SOLN
Status: DC | PRN
  Administered 2018-08-26: 1

## 2018-08-26 MED ORDER — SODIUM CHLORIDE (PF) 0.9 % IJ SOLN
INTRAMUSCULAR | Status: DC | PRN
  Administered 2018-08-26: 50 mL

## 2018-08-26 MED ORDER — HYDROMORPHONE HCL 1 MG/ML IJ SOLN
0.5000 mg | INTRAMUSCULAR | Status: DC | PRN
  Administered 2018-08-27 – 2018-08-28 (×2): 1 mg via INTRAVENOUS
  Filled 2018-08-26 (×2): qty 1

## 2018-08-26 MED ORDER — AMITRIPTYLINE HCL 50 MG PO TABS
50.0000 mg | ORAL_TABLET | Freq: Every day | ORAL | Status: DC
  Administered 2018-08-26 – 2018-08-27 (×2): 50 mg via ORAL
  Filled 2018-08-26 (×2): qty 1

## 2018-08-26 MED ORDER — FENTANYL CITRATE (PF) 100 MCG/2ML IJ SOLN
INTRAMUSCULAR | Status: AC
  Filled 2018-08-26: qty 2

## 2018-08-26 MED ORDER — PROPOFOL 10 MG/ML IV BOLUS
INTRAVENOUS | Status: DC | PRN
  Administered 2018-08-26: 20 mg via INTRAVENOUS
  Administered 2018-08-26: 30 mg via INTRAVENOUS

## 2018-08-26 MED ORDER — CHOLECALCIFEROL 100 MCG (4000 UT) PO CAPS: 4000.0000 [IU] | ORAL_CAPSULE | Freq: Every day | ORAL | Status: DC

## 2018-08-26 MED ORDER — DEXAMETHASONE SODIUM PHOSPHATE 10 MG/ML IJ SOLN
INTRAMUSCULAR | Status: AC
  Filled 2018-08-26: qty 1

## 2018-08-26 MED ORDER — NADOLOL 80 MG PO TABS
80.0000 mg | ORAL_TABLET | Freq: Every evening | ORAL | Status: DC
  Administered 2018-08-26 – 2018-08-27 (×2): 80 mg via ORAL
  Filled 2018-08-26 (×3): qty 1

## 2018-08-26 MED ORDER — ONDANSETRON HCL 4 MG/2ML IJ SOLN
INTRAMUSCULAR | Status: AC
  Filled 2018-08-26: qty 2

## 2018-08-26 MED ORDER — BUPIVACAINE-EPINEPHRINE 0.25% -1:200000 IJ SOLN
INTRAMUSCULAR | Status: AC
  Filled 2018-08-26: qty 1

## 2018-08-26 MED ORDER — ONDANSETRON HCL 4 MG/2ML IJ SOLN: 4.0000 mg | Freq: Four times a day (QID) | INTRAMUSCULAR | Status: DC | PRN

## 2018-08-26 MED ORDER — ONDANSETRON HCL 4 MG/2ML IJ SOLN
INTRAMUSCULAR | Status: DC | PRN
  Administered 2018-08-26: 4 mg via INTRAVENOUS

## 2018-08-26 MED ORDER — POVIDONE-IODINE 7.5 % EX SOLN
Freq: Once | CUTANEOUS | Status: DC
  Filled 2018-08-26: qty 118

## 2018-08-26 MED ORDER — ACETAMINOPHEN 500 MG PO TABS
1000.0000 mg | ORAL_TABLET | Freq: Four times a day (QID) | ORAL | Status: AC
  Administered 2018-08-26 – 2018-08-27 (×4): 1000 mg via ORAL
  Filled 2018-08-26 (×4): qty 2

## 2018-08-26 MED ORDER — PROPOFOL 500 MG/50ML IV EMUL
INTRAVENOUS | Status: DC | PRN
  Administered 2018-08-26: 100 ug/kg/min via INTRAVENOUS

## 2018-08-26 MED ORDER — MIDAZOLAM HCL 2 MG/2ML IJ SOLN
INTRAMUSCULAR | Status: AC
  Administered 2018-08-26: 2 mg
  Filled 2018-08-26: qty 2

## 2018-08-26 MED ORDER — POTASSIUM CHLORIDE IN NACL 20-0.9 MEQ/L-% IV SOLN
INTRAVENOUS | Status: DC
  Administered 2018-08-26: 18:00:00 via INTRAVENOUS
  Filled 2018-08-26: qty 1000

## 2018-08-26 MED ORDER — OXYCODONE HCL 5 MG PO TABS
5.0000 mg | ORAL_TABLET | ORAL | Status: DC | PRN
  Administered 2018-08-26 – 2018-08-28 (×4): 10 mg via ORAL
  Administered 2018-08-28: 5 mg via ORAL
  Filled 2018-08-26 (×4): qty 2

## 2018-08-26 MED ORDER — DIPHENHYDRAMINE HCL 12.5 MG/5ML PO ELIX: 12.5000 mg | ORAL_SOLUTION | ORAL | Status: DC | PRN

## 2018-08-26 MED ORDER — PANTOPRAZOLE SODIUM 40 MG PO TBEC
40.0000 mg | DELAYED_RELEASE_TABLET | Freq: Every day | ORAL | Status: DC
  Administered 2018-08-26 – 2018-08-28 (×3): 40 mg via ORAL
  Filled 2018-08-26 (×3): qty 1

## 2018-08-26 MED ORDER — PROMETHAZINE HCL 25 MG/ML IJ SOLN
INTRAMUSCULAR | Status: AC
  Filled 2018-08-26: qty 1

## 2018-08-26 MED ORDER — CEFAZOLIN SODIUM-DEXTROSE 2-4 GM/100ML-% IV SOLN
2.0000 g | Freq: Four times a day (QID) | INTRAVENOUS | Status: AC
  Administered 2018-08-26 – 2018-08-27 (×2): 2 g via INTRAVENOUS
  Filled 2018-08-26 (×3): qty 100

## 2018-08-26 MED ORDER — SCOPOLAMINE 1 MG/3DAYS TD PT72
MEDICATED_PATCH | TRANSDERMAL | Status: AC
  Filled 2018-08-26: qty 1

## 2018-08-26 MED ORDER — OXYCODONE HCL 5 MG PO TABS
ORAL_TABLET | ORAL | Status: AC
  Administered 2018-08-27: 10 mg via ORAL
  Filled 2018-08-26: qty 2

## 2018-08-26 SURGICAL SUPPLY — 77 items
ATTUNE PS FEM LT SZ 5 CEM KNEE (Femur) ×2 IMPLANT
ATTUNE PSRP INSR SZ5 8 KNEE (Insert) ×2 IMPLANT
BANDAGE ESMARK 6X9 LF (GAUZE/BANDAGES/DRESSINGS) ×1 IMPLANT
BASE TIBIA ATTUNE KNEE SYS SZ6 (Knees) ×1 IMPLANT
BENZOIN TINCTURE PRP APPL 2/3 (GAUZE/BANDAGES/DRESSINGS) ×2 IMPLANT
BLADE SAGITTAL 25.0X1.19X90 (BLADE) ×2 IMPLANT
BLADE SAW SGTL 13X75X1.27 (BLADE) ×2 IMPLANT
BLADE SURG 10 STRL SS (BLADE) ×4 IMPLANT
BNDG ELASTIC 6X10 VLCR STRL LF (GAUZE/BANDAGES/DRESSINGS) ×2 IMPLANT
BNDG ELASTIC 6X15 VLCR STRL LF (GAUZE/BANDAGES/DRESSINGS) ×2 IMPLANT
BNDG ESMARK 6X9 LF (GAUZE/BANDAGES/DRESSINGS) ×2
BOWL SMART MIX CTS (DISPOSABLE) ×2 IMPLANT
CEMENT HV SMART SET (Cement) ×4 IMPLANT
COVER SURGICAL LIGHT HANDLE (MISCELLANEOUS) ×2 IMPLANT
COVER WAND RF STERILE (DRAPES) ×2 IMPLANT
CUFF TOURNIQUET SINGLE 34IN LL (TOURNIQUET CUFF) ×2 IMPLANT
CUFF TOURNIQUET SINGLE 44IN (TOURNIQUET CUFF) IMPLANT
DECANTER SPIKE VIAL GLASS SM (MISCELLANEOUS) ×2 IMPLANT
DRAPE EXTREMITY T 121X128X90 (DRAPE) ×2 IMPLANT
DRAPE HALF SHEET 40X57 (DRAPES) ×4 IMPLANT
DRAPE INCISE IOBAN 66X45 STRL (DRAPES) IMPLANT
DRAPE ORTHO SPLIT 77X108 STRL (DRAPES) ×1
DRAPE SURG ORHT 6 SPLT 77X108 (DRAPES) ×1 IMPLANT
DRAPE U-SHAPE 47X51 STRL (DRAPES) ×2 IMPLANT
DRESSING AQUACEL AG SP 3.5X10 (GAUZE/BANDAGES/DRESSINGS) ×1 IMPLANT
DRSG AQUACEL AG ADV 3.5X10 (GAUZE/BANDAGES/DRESSINGS) ×2 IMPLANT
DRSG AQUACEL AG SP 3.5X10 (GAUZE/BANDAGES/DRESSINGS) ×2
DURAPREP 26ML APPLICATOR (WOUND CARE) ×2 IMPLANT
ELECT CAUTERY BLADE 6.4 (BLADE) ×2 IMPLANT
ELECT REM PT RETURN 9FT ADLT (ELECTROSURGICAL) ×2
ELECTRODE REM PT RTRN 9FT ADLT (ELECTROSURGICAL) ×1 IMPLANT
FACESHIELD WRAPAROUND (MASK) ×2 IMPLANT
GLOVE BIO SURGEON STRL SZ7 (GLOVE) ×2 IMPLANT
GLOVE BIOGEL PI IND STRL 7.0 (GLOVE) ×1 IMPLANT
GLOVE BIOGEL PI IND STRL 7.5 (GLOVE) ×1 IMPLANT
GLOVE BIOGEL PI INDICATOR 7.0 (GLOVE) ×1
GLOVE BIOGEL PI INDICATOR 7.5 (GLOVE) ×1
GLOVE SS BIOGEL STRL SZ 7.5 (GLOVE) ×1 IMPLANT
GLOVE SUPERSENSE BIOGEL SZ 7.5 (GLOVE) ×1
GOWN STRL REUS W/ TWL LRG LVL3 (GOWN DISPOSABLE) ×1 IMPLANT
GOWN STRL REUS W/ TWL XL LVL3 (GOWN DISPOSABLE) ×1 IMPLANT
GOWN STRL REUS W/TWL LRG LVL3 (GOWN DISPOSABLE) ×1
GOWN STRL REUS W/TWL XL LVL3 (GOWN DISPOSABLE) ×1
HANDPIECE INTERPULSE COAX TIP (DISPOSABLE) ×1
HOOD PEEL AWAY FACE SHEILD DIS (HOOD) ×4 IMPLANT
IMMOBILIZER KNEE 20 (SOFTGOODS) ×2
IMMOBILIZER KNEE 20 THIGH 36 (SOFTGOODS) ×1 IMPLANT
IMMOBILIZER KNEE 22 UNIV (SOFTGOODS) ×2 IMPLANT
KIT BASIN OR (CUSTOM PROCEDURE TRAY) ×2 IMPLANT
KIT TURNOVER KIT B (KITS) ×2 IMPLANT
MANIFOLD NEPTUNE II (INSTRUMENTS) ×2 IMPLANT
MARKER SKIN DUAL TIP RULER LAB (MISCELLANEOUS) ×2 IMPLANT
NEEDLE HYPO 22GX1.5 SAFETY (NEEDLE) ×4 IMPLANT
NS IRRIG 1000ML POUR BTL (IV SOLUTION) ×2 IMPLANT
PACK TOTAL JOINT (CUSTOM PROCEDURE TRAY) ×2 IMPLANT
PAD ARMBOARD 7.5X6 YLW CONV (MISCELLANEOUS) ×4 IMPLANT
PATELLA MEDIAL ATTUN 35MM KNEE (Knees) ×2 IMPLANT
PIN STEINMAN FIXATION KNEE (PIN) ×2 IMPLANT
PIN THREADED HEADED SIGMA (PIN) ×2 IMPLANT
SET HNDPC FAN SPRY TIP SCT (DISPOSABLE) ×1 IMPLANT
STRIP CLOSURE SKIN 1/2X4 (GAUZE/BANDAGES/DRESSINGS) ×2 IMPLANT
SUCTION FRAZIER HANDLE 10FR (MISCELLANEOUS) ×1
SUCTION TUBE FRAZIER 10FR DISP (MISCELLANEOUS) ×1 IMPLANT
SUT MNCRL AB 3-0 PS2 18 (SUTURE) ×2 IMPLANT
SUT VIC AB 0 CT1 27 (SUTURE) ×2
SUT VIC AB 0 CT1 27XBRD ANBCTR (SUTURE) ×2 IMPLANT
SUT VIC AB 1 CT1 27 (SUTURE) ×1
SUT VIC AB 1 CT1 27XBRD ANBCTR (SUTURE) ×1 IMPLANT
SUT VIC AB 2-0 CT1 27 (SUTURE) ×2
SUT VIC AB 2-0 CT1 TAPERPNT 27 (SUTURE) ×2 IMPLANT
SYR CONTROL 10ML LL (SYRINGE) ×4 IMPLANT
TIBIA ATTUNE KNEE SYS BASE SZ6 (Knees) ×2 IMPLANT
TOWEL OR 17X24 6PK STRL BLUE (TOWEL DISPOSABLE) ×2 IMPLANT
TOWEL OR 17X26 10 PK STRL BLUE (TOWEL DISPOSABLE) ×2 IMPLANT
TRAY CATH 16FR W/PLASTIC CATH (SET/KITS/TRAYS/PACK) IMPLANT
TRAY FOLEY CATH SILVER 16FR (SET/KITS/TRAYS/PACK) ×2 IMPLANT
WATER STERILE IRR 1000ML POUR (IV SOLUTION) ×2 IMPLANT

## 2018-08-26 NOTE — Transfer of Care (Signed)
Immediate Anesthesia Transfer of Care Note  Patient: Sara Diaz  Procedure(s) Performed: TOTAL KNEE ARTHROPLASTY (Left Knee)  Patient Location: PACU  Anesthesia Type:GA combined with regional for post-op pain  Level of Consciousness: drowsy  Airway & Oxygen Therapy: Patient Spontanous Breathing  Post-op Assessment: Report given to RN and Post -op Vital signs reviewed and stable  Post vital signs: Reviewed and stable  Last Vitals:  Vitals Value Taken Time  BP 188/91 08/26/2018  1:57 PM  Temp    Pulse 63 08/26/2018  2:02 PM  Resp 9 08/26/2018  2:02 PM  SpO2 97 % 08/26/2018  2:02 PM  Vitals shown include unvalidated device data.  Last Pain:  Vitals:   08/26/18 0858  TempSrc: Oral         Complications: No apparent anesthesia complications

## 2018-08-26 NOTE — Op Note (Signed)
MRN:     [id number] DOB/AGE:    73/29/1947 / 72 y.o.       OPERATIVE REPORT   DATE OF PROCEDURE:  08/26/2018      PREOPERATIVE DIAGNOSIS:   Primary Localized Osteoarthritis left Knee       Estimated body mass index is 26.63 kg/m as calculated from the following:   Height as of this encounter: 5\' 7"  (1.702 m).   Weight as of this encounter: 77.1 kg.                                                       POSTOPERATIVE DIAGNOSIS:   Same                                                                 PROCEDURE:  Procedure(s): TOTAL KNEE ARTHROPLASTY Using Depuy Attune RP implants #5 Femur, #6Tibia, 66mm  RP bearing, 35 Patella    SURGEON:  A. Noemi Chapel, MD   ASSISTANT: Matthew Saras, PA-C, present and scrubbed throughout the case, critical for retraction, instrumentation, and closure.  ANESTHESIA: Spinal with Adductor Nerve Block  TOURNIQUET TIME: 60 minutes   COMPLICATIONS:  None       SPECIMENS: None   INDICATIONS FOR PROCEDURE: The patient has djd of the knee with varus deformities, XR shows bone on bone arthritis. Patient has failed all conservative measures including anti-inflammatory medicines, narcotics, attempts at exercise and weight loss, cortisone injections and viscosupplementation.  Risks and benefits of surgery have been discussed, questions answered.    DESCRIPTION OF PROCEDURE: The patient identified by armband, received right adductor canal block and IV antibiotics, in the holding area at [facility]. Patient taken to the operating room, appropriate anesthetic monitors were attached. Spinal anesthesia induced with the patient in supine position, Foley catheter was inserted. Tourniquet applied high to the operative thigh. Lateral post and foot positioner applied to the table, the lower extremity was then prepped and draped in usual sterile fashion from the ankle to the tourniquet. Time-out procedure was performed. The limb was wrapped with an Esmarch  bandage and the tourniquet inflated to 365 mmHg.   We began the operation by making a 6cm anterior midline incision. Small bleeders in the skin and the subcutaneous tissue identified and cauterized. Transverse retinaculum was incised and reflected medially and a medial parapatellar arthrotomy was accomplished. the patella was everted and theprepatellar fat pad resected. The superficial medial collateral ligament was then elevated from anterior to posterior along the proximal flare of the tibia and anterior half of the menisci resected. The knee was hyperflexed exposing bone on bone arthritis. Peripheral and notch osteophytes as well as the cruciate ligaments were then resected. We continued to work our way around posteriorly along the proximal tibia, and externally rotated the tibia subluxing it out from underneath the femur. A McHale retractor was placed through the notch and a lateral Hohmann retractor placed, and an external tibial guide was placed.  The tibial cutting guide was pinned into place allowing resection of 4 mm of bone medially and about 6 mm of bone laterally because of her  varus deformity.   Satisfied with the tibial resection, we then entered the distal femur 2 mm anterior to the PCL origin with the intramedullary guide rod and applied the distal femoral cutting guide set at 58mm, with 5 degrees of valgus. This was pinned along the epicondylar axis. At this point, the distal femoral cut was accomplished without difficulty. We then sized for a 5 femoral component and pinned the guide in 3 degrees of external rotation.The chamfer cutting guide was pinned into place. The anterior, posterior, and chamfer cuts were accomplished without difficulty followed by the  RP box cutting guide and the box cut. We also removed posterior osteophytes from the posterior femoral condyles. At this time, the knee was brought into full extension. We checked our extension and flexion gaps and found them symmetric at  8.  The patella thickness measured at 75m m. We set the cutting guide at 15 and removed the posterior patella sized for 35 button and drilled the lollipop. The knee was then once again hyperflexed exposing the proximal tibia. We sized for a # 6 tibial base plate, applied the smokestack and the conical reamer followed by the the Delta fin keel punch. We then hammered into place the  RP trial femoral component, inserted a trial bearing, trial patellar button, and took the knee through range of motion from 0-130 degrees. No thumb pressure was required for patellar tracking.   At this point, all trial components were removed, a double batch of DePuy HV cement  was mixed and applied to all bony metallic mating surfaces. In order, we hammered into place the tibial tray and removed excess cement, the femoral component and removed excess cement, a 35 mm  RP bearing was inserted, and the knee brought to full extension with compression. The patellar button was clamped into place, and excess cement removed. While the cement cured the wound was irrigated out with normal saline solution pulse lavage, and exparel was injected throughout the knee. Ligament stability and patellar tracking were checked and found to be excellent..   The parapatellar arthrotomy was closed with  #1 Vicryl suture. The subcutaneous tissue with 0 and 2-0 undyed Vicryl suture, and 4-0 Monocryl.. A dressing of Aquaseal, 4 x 4, dressing sponges, Webril, and Ace wrap applied. Needle and sponge count were correct times 2.The patient awakened, extubated, and taken to recovery room without difficulty. Vascular status was normal, pulses 2+ and symmetric.    [name] 01/07/2018, 8:56 AM

## 2018-08-26 NOTE — Care Plan (Signed)
Ortho Bundle Case Management Note  Patient Details  Name: Sara Diaz MRN: [id number] Date of Birth: Oct 13, 1946  Met with patient in the office. Will discharge to home with family and HHPT. Equipment ordered                   DME Arranged:  CPM, Walker rolling, Bedside commode DME Agency:  [employer]  HH Arranged:  PT [place] Agency:  [place]  Additional Comments: Please contact me with any questions of if this plan should need to change.  Ladell Heads,  [place] [facility] Specialist  [phone] 08/26/2018, 10:21 AM

## 2018-08-26 NOTE — Anesthesia Procedure Notes (Signed)
Procedure Name: MAC Date/Time: 08/26/2018 12:18 PM Performed by: Imagene Riches, CRNA Pre-anesthesia Checklist: Patient identified, Emergency Drugs available, Suction available and Patient being monitored Oxygen Delivery Method: Simple face mask

## 2018-08-26 NOTE — Progress Notes (Signed)
Orthopedic Tech Progress Note Patient Details:  Sara Diaz 02/09/1946 [id number]  CPM Left Knee CPM Left Knee: On Left Knee Flexion (Degrees): 90 Left Knee Extension (Degrees): 0 Additional Comments: foot roll  Post Interventions Patient Tolerated: Well Instructions Provided: Care of device  [name] 08/26/2018, 3:03 PM

## 2018-08-26 NOTE — Plan of Care (Signed)
  Problem: Education: Goal: Knowledge of General Education information will improve Description Including pain rating scale, medication(s)/side effects and non-pharmacologic comfort measures Outcome: Progressing   

## 2018-08-26 NOTE — Anesthesia Procedure Notes (Signed)
Spinal  Patient location during procedure: OR Start time: 08/26/2018 11:45 AM End time: 08/26/2018 11:55 AM Staffing Anesthesiologist: Freddrick March, MD Performed: anesthesiologist  Preanesthetic Checklist Completed: patient identified, surgical consent, pre-op evaluation, timeout performed, IV checked, risks and benefits discussed and monitors and equipment checked Spinal Block Patient position: sitting Prep: site prepped and draped and DuraPrep Patient monitoring: cardiac monitor, continuous pulse ox and blood pressure Approach: midline Location: L3-4 Injection technique: single-shot Needle Needle type: Pencan  Needle gauge: 24 G Needle length: 9 cm Assessment Sensory level: T6 Additional Notes Functioning IV was confirmed and monitors were applied. Sterile prep and drape, including hand hygiene and sterile gloves were used. The patient was positioned and the spine was prepped. The skin was anesthetized with lidocaine.  Free flow of clear CSF was obtained prior to injecting local anesthetic into the CSF.  The spinal needle aspirated freely following injection.  The needle was carefully withdrawn.  The patient tolerated the procedure well.

## 2018-08-26 NOTE — Interval H&P Note (Signed)
History and Physical Interval Note:  08/26/2018 11:38 AM  Sara Diaz  has presented today for surgery, with the diagnosis of djd left knee  The various methods of treatment have been discussed with the patient and family. After consideration of risks, benefits and other options for treatment, the patient has consented to  Procedure(s): TOTAL KNEE ARTHROPLASTY (Left) as a surgical intervention .  The patient's history has been reviewed, patient examined, no change in status, stable for surgery.  I have reviewed the patient's chart and labs.  Questions were answered to the patient's satisfaction.     [name]

## 2018-08-26 NOTE — Anesthesia Procedure Notes (Signed)
Anesthesia Regional Block: Adductor canal block   Pre-Anesthetic Checklist: ,, timeout performed, Correct Patient, Correct Site, Correct Laterality, Correct Procedure, Correct Position, site marked, Risks and benefits discussed,  Surgical consent,  Pre-op evaluation,  At surgeon's request and post-op pain management  Laterality: Left  Prep: Maximum Sterile Barrier Precautions used, chloraprep       Needles:  Injection technique: Single-shot  Needle Type: Echogenic Stimulator Needle     Needle Length: 9cm  Needle Gauge: 22     Additional Needles:   Procedures:,,,, ultrasound used (permanent image in chart),,,,  Narrative:  Start time: 08/26/2018 10:20 AM End time: 08/26/2018 10:30 AM Injection made incrementally with aspirations every 5 mL.  Performed by: Personally  Anesthesiologist: Freddrick March, MD  Additional Notes: Monitors applied. No increased pain on injection. No increased resistance to injection. Injection made in 5cc increments. Good needle visualization. Patient tolerated procedure well.

## 2018-08-27 ENCOUNTER — Encounter (HOSPITAL_COMMUNITY): Payer: Self-pay | Admitting: Orthopedic Surgery

## 2018-08-27 LAB — CBC
HEMATOCRIT: 37.2 % (ref 36.0–46.0)
HEMOGLOBIN: 12.1 g/dL (ref 12.0–15.0)
MCH: 29.4 pg (ref 26.0–34.0)
MCHC: 32.5 g/dL (ref 30.0–36.0)
MCV: 90.5 fL (ref 80.0–100.0)
PLATELETS: 185 10*3/uL (ref 150–400)
RBC: 4.11 MIL/uL (ref 3.87–5.11)
RDW: 13.8 % (ref 11.5–15.5)
WBC: 11.3 10*3/uL — AB (ref 4.0–10.5)
nRBC: 0 % (ref 0.0–0.2)

## 2018-08-27 LAB — BASIC METABOLIC PANEL
Anion gap: 8 (ref 5–15)
BUN: 11 mg/dL (ref 8–23)
CALCIUM: 8.7 mg/dL — AB (ref 8.9–10.3)
CO2: 23 mmol/L (ref 22–32)
CREATININE: 0.91 mg/dL (ref 0.44–1.00)
Chloride: 105 mmol/L (ref 98–111)
GFR calc non Af Amer: >60 mL/min (ref >60)
Glucose, Bld: 284 mg/dL — ABNORMAL HIGH (ref 70–99)
Potassium: 4.3 mmol/L (ref 3.5–5.1)
SODIUM: 136 mmol/L (ref 135–145)

## 2018-08-27 LAB — GLUCOSE, CAPILLARY
GLUCOSE-CAPILLARY: 196 mg/dL — AB (ref 70–99)
GLUCOSE-CAPILLARY: 231 mg/dL — AB (ref 70–99)
GLUCOSE-CAPILLARY: 413 mg/dL — AB (ref 70–99)
Glucose-Capillary: 247 mg/dL — ABNORMAL HIGH (ref 70–99)
Glucose-Capillary: 315 mg/dL — ABNORMAL HIGH (ref 70–99)

## 2018-08-27 MED ORDER — INSULIN ASPART 100 UNIT/ML ~~LOC~~ SOLN
0.0000 [IU] | Freq: Three times a day (TID) | SUBCUTANEOUS | Status: DC
  Administered 2018-08-27: 20 [IU] via SUBCUTANEOUS
  Administered 2018-08-28 (×2): 7 [IU] via SUBCUTANEOUS

## 2018-08-27 MED ORDER — APIXABAN 2.5 MG PO TABS
2.5000 mg | ORAL_TABLET | Freq: Two times a day (BID) | ORAL | Status: DC
  Administered 2018-08-27 – 2018-08-28 (×3): 2.5 mg via ORAL
  Filled 2018-08-27 (×3): qty 1

## 2018-08-27 MED ORDER — INSULIN ASPART 100 UNIT/ML ~~LOC~~ SOLN: 0.0000 [IU] | Freq: Three times a day (TID) | SUBCUTANEOUS | Status: DC

## 2018-08-27 MED ORDER — SODIUM CHLORIDE 0.9 % IV BOLUS: 500.0000 mL | Freq: Once | INTRAVENOUS | Status: DC

## 2018-08-27 NOTE — Progress Notes (Signed)
Physical Therapy Treatment Patient Details Name: Sara Diaz MRN: [id number] DOB: [birth year] Today's Date: 08/27/2018    History of Present Illness Pt is 72 y.o. female s/p elective L TKA (08/27/18) secondary to osteoarthritis. PMH significant for Type II diabetes mellitus, RA, osteoporosis, HTN, R knee arthroscopy, cervical fusion, arthritis, depression, neurmuscular disorder and dyspnea.       PT Comments    Pt reported minimal pain at start of treatment and that she used CPM again earlier today. Pt continues to maintain proper form with mobility and ambulation, following verbal cues without need for repetition. She was able to progress from step-to to step-through pattern with hands on min guard for safety. Numbness and decreased sensation around incisional site still present, but improving. DC plan remains appropriate at this time. PT will continue to follow.    Follow Up Recommendations  Follow surgeon's recommendation for DC plan and follow-up therapies     Equipment Recommendations  None recommended by PT       Precautions / Restrictions Precautions Precautions: Fall Restrictions Weight Bearing Restrictions: Yes LLE Weight Bearing: Weight bearing as tolerated    Mobility  Bed Mobility Overal bed mobility: Needs Assistance Bed Mobility: Supine to Sit     Supine to sit: Min guard;HOB elevated     General bed mobility comments: hands on min guard for safety, used bedrail  Transfers Overall transfer level: Needs assistance Equipment used: Rolling walker (2 wheeled) Transfers: Sit to/from Stand Sit to Stand: Min guard         General transfer comment: hands on min guard for safety, no cuing needed  Ambulation/Gait Ambulation/Gait assistance: Min guard Gait Distance (Feet): 110 Feet Assistive device: Rolling walker (2 wheeled) Gait Pattern/deviations: Step-to pattern;Step-through pattern;Decreased step length - right;Decreased stance time - left Gait  velocity: slowed Gait velocity interpretation: 1.31 - 2.62 ft/sec, indicative of limited community ambulator General Gait Details: hands on min gaurd for safety, pt able to verbalize cues from earlier today and maintained appropriate ambulation with RW without need for additional cuing. Ambulating step-to pattern and progressing to step-through towards end of ambulation with decreased stride length.        Balance Overall balance assessment: Needs assistance Sitting-balance support: Feet supported Sitting balance-Leahy Scale: Good     Standing balance support: Bilateral upper extremity supported Standing balance-Leahy Scale: Poor                              Cognition Arousal/Alertness: Awake/alert Behavior During Therapy: WFL for tasks assessed/performed Overall Cognitive Status: Within Functional Limits for tasks assessed                                        Exercises Total Joint Exercises Quad Sets: AROM;Both;5 reps;Supine Heel Slides: AROM;Strengthening;Left;5 reps;Supine Straight Leg Raises: AROM;Strengthening;Left;5 reps;Supine    General Comments General comments (skin integrity, edema, etc.): Some edema still present, pt's numbness/decreased sensation s/p R TKA is improving with symptoms present from mid thigh to mid shin of LLE. Pt reported increased soreness with posterior aspect of L knee and said she had used her CPM machine again today.  Reviewed HEP and educated on importance of AROM.       Pertinent Vitals/Pain Pain Assessment: Faces Faces Pain Scale: Hurts a little bit Pain Location: left knee (posterior aspect) Pain Descriptors / Indicators: Grimacing;Aching;Sore Pain Intervention(s):  Limited activity within patient's tolerance;Monitored during session           PT Goals (current goals can now be found in the care plan section) Acute Rehab PT Goals Patient Stated Goal: get up and walk every 30 minutes Progress towards PT  goals: Progressing toward goals    Frequency    7X/week      PT Plan Current plan remains appropriate       AM-PAC PT "6 Clicks" Daily Activity  Outcome Measure  Difficulty turning over in bed (including adjusting bedclothes, sheets and blankets)?: A Little Difficulty moving from lying on back to sitting on the side of the bed? : Unable Difficulty sitting down on and standing up from a chair with arms (e.g., wheelchair, bedside commode, etc,.)?: Unable Help needed moving to and from a bed to chair (including a wheelchair)?: A Little Help needed walking in hospital room?: A Little Help needed climbing 3-5 steps with a railing? : A Lot 6 Click Score: 13    End of Session Equipment Utilized During Treatment: Gait belt Activity Tolerance: Patient tolerated treatment well Patient left: in bed;with call bell/phone within reach   PT Visit Diagnosis: Other abnormalities of gait and mobility (R26.89);Difficulty in walking, not elsewhere classified (R26.2);Pain Pain - Right/Left: Left Pain - part of body: Knee     Time: 8295-6213 PT Time Calculation (min) (ACUTE ONLY): 19 min  Charges:  $Gait Training: 8-22 mins                     Vernell Morgans, SPT Acute Rehabilitation Services Office [phone]    Vernell Morgans 08/27/2018, 5:58 PM

## 2018-08-27 NOTE — Evaluation (Signed)
Physical Therapy Evaluation Patient Details Name: Sara Diaz MRN: [id number] DOB: [birth year] Today's Date: 08/27/2018   History of Present Illness  Pt is 72 y.o. female s/p elective L TKA (08/27/18) secondary to osteoarthritis. PMH significant for Type II diabetes mellitus, RA, osteoporosis, HTN, R knee arthroscopy, cervical fusion, arthritis, depression, neurmuscular disorder and dyspnea.     Clinical Impression  PTA pt was independent with daily activities. Pt eager to work with therapy, reporting minimal pain following pain meds. She was able to complete all mobility hands on min guard with minimal cuing for hand placement and staying within the RW during ambulation in the room. Pt reports numbness from mid thigh to ankle of LLE, and has decreased ROM limited by pain, edema and incisional site dressing. Skilled therapy necessary to improve ROM, strength, and endurance to return to PLOF with daily activities. Pt agrees with DC planning home with HHPT, which remains appropriate at this time. PT will continue to follow acutely.     Follow Up Recommendations Follow surgeon's recommendation for DC plan and follow-up therapies    Equipment Recommendations  None recommended by PT       Precautions / Restrictions Precautions Precautions: Fall Restrictions Weight Bearing Restrictions: Yes LLE Weight Bearing: Weight bearing as tolerated      Mobility  Bed Mobility Overal bed mobility: Needs Assistance Bed Mobility: Supine to Sit     Supine to sit: Min guard     General bed mobility comments: min guard for safety, used bedrail  Transfers Overall transfer level: Needs assistance Equipment used: Rolling walker (2 wheeled) Transfers: Sit to/from Stand Sit to Stand: Min guard         General transfer comment: hands on min guard for safety, vc for hand placement.   Ambulation/Gait Ambulation/Gait assistance: Min guard Gait Distance (Feet): 20 Feet Assistive device: Rolling  walker (2 wheeled) Gait Pattern/deviations: Step-to pattern;Decreased step length - left Gait velocity: slowed Gait velocity interpretation: <1.8 ft/sec, indicate of risk for recurrent falls General Gait Details: hands on min guard for safety, vc to decrease speed and stay within RW. Pt corrected easilly without need for repetitive cuing.        Balance Overall balance assessment: Needs assistance Sitting-balance support: Feet supported Sitting balance-Leahy Scale: Good     Standing balance support: Bilateral upper extremity supported Standing balance-Leahy Scale: Poor                               Pertinent Vitals/Pain Pain Assessment: Faces Faces Pain Scale: Hurts a little bit Pain Location: left knee (posterior aspect) Pain Descriptors / Indicators: Grimacing;Sore Pain Intervention(s): Limited activity within patient's tolerance;Monitored during session;Premedicated before session;Repositioned    Home Living Family/patient expects to be discharged to:: Private residence Living Arrangements: Spouse/significant other Available Help at Discharge: Family Type of Home: House Home Access: Stairs to enter Entrance Stairs-Rails: Right Entrance Stairs-Number of Steps: 1 Home Layout: One level Home Equipment: Environmental consultant - 2 wheels;Shower seat;Toilet riser      Prior Function Level of Independence: Independent                  Extremity/Trunk Assessment   Upper Extremity Assessment Upper Extremity Assessment: Overall WFL for tasks assessed    Lower Extremity Assessment Lower Extremity Assessment: LLE deficits/detail LLE Deficits / Details: decreased ROM limited by edema and dressing, decreased sensation/numbness from mid thigh to ankle LLE Sensation: decreased light touch  Cervical / Trunk Assessment Cervical / Trunk Assessment: Normal  Communication   Communication: No difficulties  Cognition Arousal/Alertness: Awake/alert Behavior During Therapy:  WFL for tasks assessed/performed Overall Cognitive Status: Within Functional Limits for tasks assessed                                        General Comments General comments (skin integrity, edema, etc.): Some edema noted around incisional site with minimal redness under dressing. Pt reporting numbness from mid thigh to ankle s/p TKA. Educated on HEP, importance of mobility throughout the day, and elevating heel of LLE when resting to improve extension.     Exercises Total Joint Exercises Ankle Circles/Pumps: AROM;Both;10 reps Quad Sets: AROM;Left;Seated;Other reps (comment)(3 reps) Goniometric ROM: Flexion: 110 deg; Extension 10 deg   Assessment/Plan    PT Assessment Patient needs continued PT services  PT Problem List Decreased strength;Decreased range of motion;Decreased activity tolerance;Decreased balance;Decreased mobility;Decreased coordination;Decreased knowledge of use of DME;Impaired sensation;Pain       PT Treatment Interventions DME instruction;Gait training;Stair training;Functional mobility training;Therapeutic activities;Therapeutic exercise;Balance training;Neuromuscular re-education    PT Goals (Current goals can be found in the Care Plan section)  Acute Rehab PT Goals Patient Stated Goal: get up and walk every 30 minutes PT Goal Formulation: With patient Time For Goal Achievement: 09/10/18 Potential to Achieve Goals: Good    Frequency 7X/week    AM-PAC PT "6 Clicks" Daily Activity  Outcome Measure Difficulty turning over in bed (including adjusting bedclothes, sheets and blankets)?: A Little Difficulty moving from lying on back to sitting on the side of the bed? : Unable Difficulty sitting down on and standing up from a chair with arms (e.g., wheelchair, bedside commode, etc,.)?: Unable Help needed moving to and from a bed to chair (including a wheelchair)?: A Little Help needed walking in hospital room?: A Little Help needed climbing 3-5  steps with a railing? : A Lot 6 Click Score: 13    End of Session Equipment Utilized During Treatment: Gait belt Activity Tolerance: Patient tolerated treatment well Patient left: in chair;with call bell/phone within reach;with chair alarm set;with family/visitor present   PT Visit Diagnosis: Other abnormalities of gait and mobility (R26.89);Difficulty in walking, not elsewhere classified (R26.2);Pain Pain - Right/Left: Left Pain - part of body: Knee    Time: 5003-7048 PT Time Calculation (min) (ACUTE ONLY): 29 min   Charges:   PT Evaluation $PT Eval Low Complexity: 1 Low PT Treatments $Gait Training: 8-22 mins        Vernell Morgans, SPT Acute Rehabilitation Services Office [phone]   Vernell Morgans 08/27/2018, 2:26 PM

## 2018-08-27 NOTE — Progress Notes (Signed)
Inpatient Diabetes Program Recommendations  AACE/ADA: New Consensus Statement on Inpatient Glycemic Control (2015)  Target Ranges:  Prepandial:   less than 140 mg/dL      Peak postprandial:   less than 180 mg/dL (1-2 hours)      Critically ill patients:  140 - 180 mg/dL   Results for DESTANE, SPEAS (MRN [id number]) as of 08/27/2018 15:05  Ref. Range 08/27/2018 06:38 08/27/2018 11:57 08/27/2018 11:58  Glucose-Capillary Latest Ref Range: 70 - 99 mg/dL 196 (H) 231 (H) 247 (H)   Review of Glycemic Control  Diabetes history: DM 2 Outpatient Diabetes medications: Levemir 30 units qam, Trulicity 1.5 mg QSunday Current orders for Inpatient glycemic control: Levemir 30 units Qam  Inpatient Diabetes Program Recommendations:    A1c 6.6 on 11/1 Patient receiving decadron 10 mg Q8 hours. Glucose 196, 231, 247. Consider adding Novolog Moderate Correction scale 0-15 units tid and Novolog hs scale.   Thanks,  Tama Headings RN, MSN, BC-ADM Inpatient Diabetes Coordinator Team Pager [phone] (8a-5p)

## 2018-08-27 NOTE — Progress Notes (Signed)
Subjective: 1 Day Post-Op Procedure(s) (LRB): TOTAL KNEE ARTHROPLASTY (Left) Patient reports pain as 4 on 0-10 scale.    Objective: Vital signs in last 24 hours: Temp:  [97.8 F (36.6 C)-98 F (36.7 C)] 97.8 F (36.6 C) (11/12 0449) Pulse Rate:  [58-80] 77 (11/12 0449) Resp:  [10-16] 16 (11/12 0449) BP: (132-190)/(57-94) 132/68 (11/12 0449) SpO2:  [90 %-99 %] 93 % (11/12 0449)  Intake/Output from previous day: 11/11 0701 - 11/12 0700 In: 1301.9 [P.O.:240; I.V.:1061.9] Out: 1750 [Urine:1700; Blood:50] Intake/Output this shift: Total I/O In: 120 [P.O.:120] Out: -   Recent Labs    08/27/18 0154  HGB 12.1   Recent Labs    08/27/18 0154  WBC 11.3*  RBC 4.11  HCT 37.2  PLT 185   Recent Labs    08/27/18 0154  NA 136  K 4.3  CL 105  CO2 23  BUN 11  CREATININE 0.91  GLUCOSE 284*  CALCIUM 8.7*   No results for input(s): LABPT, INR in the last 72 hours.  ABD soft Neurovascular intact Sensation intact distally Intact pulses distally Dorsiflexion/Plantar flexion intact Incision: scant drainage  Anticipated LOS equal to or greater than 2 midnights due to - Age 73 and older with one or more of the following:  - Obesity  - Expected need for hospital services (PT, OT, Nursing) required for safe  discharge  - Anticipated need for postoperative skilled nursing care or inpatient rehab  - Active co-morbidities: None OR   - Unanticipated findings during/Post Surgery: Slow post-op progression: GI, pain control, mobility  - Patient is a high risk of re-admission due to: None   Assessment/Plan: 1 Day Post-Op Procedure(s) (LRB): TOTAL KNEE ARTHROPLASTY (Left)  Principal Problem:   Primary localized osteoarthritis of left knee Active Problems:   Cervical spinal stenosis   Type II diabetes mellitus (HCC)   RA (rheumatoid arthritis) (HCC)   Pulmonary embolism (HCC)   PONV (postoperative nausea and vomiting)   Neuromuscular disorder (HCC)   Hypothyroidism  Hypertension   DVT (deep vein thrombosis) in pregnancy   Depression   Complication of anesthesia nausea and vomiting   Barrett esophagus  Will use eliquis for DVT prophylaxis in this patient with history of DVT and PE.   Patient got lightheaded and dizzy during am walk with me    I ordered fluid bolus times two   One now and one at 1:30 pm.  Plan on discharge tomorrow due to slow progression, light headedness and dizziness.   Advance diet Up with therapy Plan for discharge tomorrow    Linda Hedges 08/27/2018, 9:19 AM

## 2018-08-27 NOTE — Anesthesia Postprocedure Evaluation (Signed)
Anesthesia Post Note  Patient: Sara Diaz  Procedure(s) Performed: TOTAL KNEE ARTHROPLASTY (Left Knee)     Patient location during evaluation: PACU Anesthesia Type: Regional and Spinal Level of consciousness: oriented and awake and alert Pain management: pain level controlled Vital Signs Assessment: post-procedure vital signs reviewed and stable Respiratory status: spontaneous breathing, respiratory function stable and patient connected to nasal cannula oxygen Cardiovascular status: blood pressure returned to baseline and stable Postop Assessment: no headache, no backache and no apparent nausea or vomiting Anesthetic complications: no    Last Vitals:  Vitals:   08/27/18 0449 08/27/18 0815  BP: 132/68 129/79  Pulse: 77 76  Resp: 16 18  Temp: 36.6 C 36.6 C  SpO2: 93% 94%    Last Pain:  Vitals:   08/27/18 1005  TempSrc:   PainSc: 3                   L 

## 2018-08-27 NOTE — Progress Notes (Signed)
Orthopedic Tech Progress Note Patient Details:  Sara Diaz October 08, 1946 [id number]  CPM Left Knee CPM Left Knee: On Left Knee Flexion (Degrees): 90 Left Knee Extension (Degrees): 0 Additional Comments: foot roll  Post Interventions Patient Tolerated: Well Instructions Provided: Care of device  [name] 08/27/2018, 4:04 AM

## 2018-08-27 NOTE — Care Management Note (Signed)
Case Management Note  Patient Details  Name: Sara Diaz MRN: [id number] Date of Birth: Jan 21, 1946  Subjective/Objective:   Left TKA                 Action/Plan: NCM spoke to pt and offered choice for [facility]. Pt agreeable to [facility] for HHPT. (preoperatively arranged from surgeon's office) She has RW, 3n1 bedside commode and CPM at home.   Expected Discharge Date:  08/28/2018             Expected Discharge Plan:  [place]  In-House Referral:  NA  Discharge planning Services  CM Consult  Post Acute Care Choice:  Home Health Choice offered to:  Patient  DME Arranged:  CPM, Walker rolling, Bedside commode DME Agency:  [employer]  HH Arranged:  PT [place] Agency:  [place]  Status of Service:  Completed, signed off  If discussed at [place] of Stay Meetings, dates discussed:    Additional Comments:  Erenest Rasher, RN 08/27/2018, 10:52 AM

## 2018-08-27 NOTE — Discharge Instructions (Signed)
Information on my medicine - ELIQUIS (apixaban)  This medication education was reviewed with me or my healthcare representative as part of my discharge preparation.  The pharmacist that spoke with me during my hospital stay was:  Wayland Salinas, [facility]  Why was Eliquis prescribed for you? Eliquis was prescribed for you to reduce the risk of blood clots forming after orthopedic surgery.    What do You need to know about Eliquis? Take your Eliquis TWICE DAILY - one tablet in the morning and one tablet in the evening with or without food.  It would be best to take the dose about the same time each day.  If you have difficulty swallowing the tablet whole please discuss with your pharmacist how to take the medication safely.  Take Eliquis exactly as prescribed by your doctor and DO NOT stop taking Eliquis without talking to the doctor who prescribed the medication.  Stopping without other medication to take the place of Eliquis may increase your risk of developing a clot.  After discharge, you should have regular check-up appointments with your healthcare provider that is prescribing your Eliquis.  What do you do if you miss a dose? If a dose of ELIQUIS is not taken at the scheduled time, take it as soon as possible on the same day and twice-daily administration should be resumed.  The dose should not be doubled to make up for a missed dose.  Do not take more than one tablet of ELIQUIS at the same time.  Important Safety Information A possible side effect of Eliquis is bleeding. You should call your healthcare provider right away if you experience any of the following: ? Bleeding from an injury or your nose that does not stop. ? Unusual colored urine (red or dark brown) or unusual colored stools (red or black). ? Unusual bruising for unknown reasons. ? A serious fall or if you hit your head (even if there is no bleeding).  Some medicines may interact with Eliquis and  might increase your risk of bleeding or clotting while on Eliquis. To help avoid this, consult your healthcare provider or pharmacist prior to using any new prescription or non-prescription medications, including herbals, vitamins, non-steroidal anti-inflammatory drugs (NSAIDs) and supplements.  This website has more information on Eliquis (apixaban): http://www.eliquis.com/eliquis/home

## 2018-08-28 LAB — GLUCOSE, CAPILLARY
GLUCOSE-CAPILLARY: 234 mg/dL — AB (ref 70–99)
Glucose-Capillary: 208 mg/dL — ABNORMAL HIGH (ref 70–99)
Glucose-Capillary: 217 mg/dL — ABNORMAL HIGH (ref 70–99)

## 2018-08-28 LAB — BASIC METABOLIC PANEL
Anion gap: 5 (ref 5–15)
BUN: 15 mg/dL (ref 8–23)
CHLORIDE: 109 mmol/L (ref 98–111)
CO2: 25 mmol/L (ref 22–32)
Calcium: 8.9 mg/dL (ref 8.9–10.3)
Creatinine, Ser: 1.01 mg/dL — ABNORMAL HIGH (ref 0.44–1.00)
GFR calc Af Amer: >60 mL/min (ref >60)
GFR calc non Af Amer: 54 mL/min — ABNORMAL LOW (ref >60)
Glucose, Bld: 215 mg/dL — ABNORMAL HIGH (ref 70–99)
POTASSIUM: 4.3 mmol/L (ref 3.5–5.1)
Sodium: 139 mmol/L (ref 135–145)

## 2018-08-28 LAB — CBC
HEMATOCRIT: 34.6 % — AB (ref 36.0–46.0)
HEMOGLOBIN: 11 g/dL — AB (ref 12.0–15.0)
MCH: 28.5 pg (ref 26.0–34.0)
MCHC: 31.8 g/dL (ref 30.0–36.0)
MCV: 89.6 fL (ref 80.0–100.0)
Platelets: 221 10*3/uL (ref 150–400)
RBC: 3.86 MIL/uL — AB (ref 3.87–5.11)
RDW: 13.8 % (ref 11.5–15.5)
WBC: 19.3 10*3/uL — ABNORMAL HIGH (ref 4.0–10.5)
nRBC: 0 % (ref 0.0–0.2)

## 2018-08-28 MED ORDER — OXYCODONE HCL 5 MG PO TABS: ORAL_TABLET | ORAL | 0 refills | Status: DC

## 2018-08-28 MED ORDER — POLYETHYLENE GLYCOL 3350 17 G PO PACK: PACK | ORAL | 0 refills | Status: DC

## 2018-08-28 MED ORDER — APIXABAN 2.5 MG PO TABS: ORAL_TABLET | ORAL | 0 refills | Status: DC

## 2018-08-28 MED ORDER — GABAPENTIN 300 MG PO CAPS: ORAL_CAPSULE | ORAL | 0 refills | Status: DC

## 2018-08-28 MED ORDER — DOCUSATE SODIUM 100 MG PO CAPS: ORAL_CAPSULE | ORAL | 0 refills | Status: DC

## 2018-08-28 NOTE — Progress Notes (Signed)
Physical Therapy Treatment Patient Details Name: Sara Diaz MRN: [id number] DOB: May 10, 1946 Today's Date: 08/28/2018    History of Present Illness Pt is 72 y.o. female s/p elective L TKA (08/27/18) secondary to osteoarthritis. PMH significant for Type II diabetes mellitus, RA, osteoporosis, HTN, R knee arthroscopy, cervical fusion, arthritis, depression, neurmuscular disorder and dyspnea.       PT Comments    Pt eager to work with therapy and go home. Pt able to complete all mobility with hands on min guard for safety, with minimal cuing for hand placement and slower movement for safety. Education provided on HEP, frequency, and importance of rest breaks for pain management. Addressed questions regarding car transfer and pt able to demonstrate safe form with "car transfer" on EOB. Pt has no other questions or concerns at this time, DC plan remains appropriate.  Follow Up Recommendations  Follow surgeon's recommendation for DC plan and follow-up therapies     Equipment Recommendations  None recommended by PT       Precautions / Restrictions Precautions Precautions: Fall Restrictions Weight Bearing Restrictions: Yes LLE Weight Bearing: Weight bearing as tolerated    Mobility  Bed Mobility Overal bed mobility: Needs Assistance Bed Mobility: Sit to Supine     Supine to sit: Min guard     General bed mobility comments: min guard for safety, practiced getting into car with cuing for safe hand placement during car transfer.  Transfers Overall transfer level: Needs assistance Equipment used: Rolling walker (2 wheeled) Transfers: Sit to/from Stand Sit to Stand: Min guard         General transfer comment: hands on min guard for safety  Ambulation/Gait Ambulation/Gait assistance: Min guard Gait Distance (Feet): 5 Feet Assistive device: Rolling walker (2 wheeled) Gait Pattern/deviations: Step-to pattern;Decreased stance time - left Gait velocity: slowed Gait velocity  interpretation: <1.8 ft/sec, indicate of risk for recurrent falls General Gait Details: hands on min guard for safety, vc to weight shift in stance before initiating gait, pt recalled cuing from earlier and maintained looking forward       Balance Overall balance assessment: Needs assistance Sitting-balance support: Feet supported Sitting balance-Leahy Scale: Good     Standing balance support: Bilateral upper extremity supported Standing balance-Leahy Scale: Fair                              Cognition Arousal/Alertness: Awake/alert Behavior During Therapy: WFL for tasks assessed/performed Overall Cognitive Status: Within Functional Limits for tasks assessed                                        Exercises Total Joint Exercises Quad Sets: AROM;Left;10 reps;Supine Heel Slides: AROM;Left;15 reps;Seated Hip ABduction/ADduction: AROM;Left;5 reps;Other (comment)(in long sitting, reps limited by pain) Long Arc Quad: AROM;Strengthening;Left;10 reps;Seated Goniometric ROM: Flexion: 100 deg; Extension: 10 deg  All exercises performed in limited ROM due to pain and edema.     General Comments General comments (skin integrity, edema, etc.): Educated on HEP, frequency, pain management and mobility, and addressed concerns with car transfers. Pt reports increased pain as numbness around incisional site is decreasing.       Pertinent Vitals/Pain Pain Assessment: Faces Faces Pain Scale: Hurts even more Pain Location: left knee Pain Descriptors / Indicators: Grimacing;Sore;Guarding;Tightness           PT Goals (current goals can now  be found in the care plan section) Acute Rehab PT Goals Patient Stated Goal: get up and walk every 30 minutes PT Goal Formulation: With patient Time For Goal Achievement: 09/10/18 Potential to Achieve Goals: Good Progress towards PT goals: Progressing toward goals    Frequency    7X/week      PT Plan Current plan  remains appropriate       AM-PAC PT "6 Clicks" Daily Activity  Outcome Measure  Difficulty turning over in bed (including adjusting bedclothes, sheets and blankets)?: A Little Difficulty moving from lying on back to sitting on the side of the bed? : A Little Difficulty sitting down on and standing up from a chair with arms (e.g., wheelchair, bedside commode, etc,.)?: Unable Help needed moving to and from a bed to chair (including a wheelchair)?: A Little Help needed walking in hospital room?: A Little Help needed climbing 3-5 steps with a railing? : A Little 6 Click Score: 16    End of Session Equipment Utilized During Treatment: Gait belt Activity Tolerance: Patient tolerated treatment well Patient left: in chair;with call bell/phone within reach;with chair alarm set   PT Visit Diagnosis: Other abnormalities of gait and mobility (R26.89);Difficulty in walking, not elsewhere classified (R26.2);Pain Pain - Right/Left: Left Pain - part of body: Knee     Time: 7494-4967 PT Time Calculation (min) (ACUTE ONLY): 17 min  Charges:  $Therapeutic Exercise: 8-22 mins                     Vernell Morgans, SPT Acute Rehabilitation Services Office [phone]    Vernell Morgans 08/28/2018, 5:08 PM

## 2018-08-28 NOTE — Discharge Summary (Signed)
Patient ID: Sara Diaz MRN: [id number] DOB/AGE: Aug 16, 1946 72 y.o.  Admit date: 08/26/2018 Discharge date: 08/28/2018  Admission Diagnoses:  Principal Problem:   Primary localized osteoarthritis of left knee Active Problems:   Cervical spinal stenosis   Type II diabetes mellitus (HCC)   RA (rheumatoid arthritis) (HCC)   Pulmonary embolism (HCC)   PONV (postoperative nausea and vomiting)   Neuromuscular disorder (HCC)   Hypothyroidism   Hypertension   DVT (deep vein thrombosis) in pregnancy   Depression   Complication of anesthesia nausea and vomiting   Barrett esophagus   Discharge Diagnoses:  Same  Past Medical History:  Diagnosis Date  . Arthritis    "neck ,hands, back" (05/17/2017)  . Barrett esophagus   . Complication of anesthesia nausea and vomiting   . Depression   . DVT (deep vein thrombosis) in pregnancy 1973   BLE; "I had a 10# pregnancy"  . Dyspnea    with exertion  . GERD (gastroesophageal reflux disease)    off prilosec .developed cough .thought to be allergic  to prilosec  . Headache(784.0)    "stopped having them daily after back OR in 2012; restarted in 2017; had 1-2/month" (05/17/2017)  . High cholesterol   . History of kidney stones 2006,2007  . Hypertension   . Hypothyroidism   . Neuromuscular disorder (HCC)    weakness r leg.  . Osteoporosis   . Pneumonia 2015-2016 X 2  . PONV (postoperative nausea and vomiting)   . Pulmonary embolism ([place]) 1973 X 2  . RA (rheumatoid arthritis) ([place])   . Type II diabetes mellitus ([place])    Type II    Surgeries: Procedure(s): TOTAL KNEE ARTHROPLASTY on 08/26/2018   Consultants:   Discharged Condition: Improved  Hospital Course: Sara Diaz is an 72 y.o. female who was admitted 08/26/2018 for operative treatment ofPrimary localized osteoarthritis of left knee. Patient has severe unremitting pain that affects sleep, daily activities, and work/hobbies. After pre-op clearance the patient was taken to  the operating room on 08/26/2018 and underwent  Procedure(s): TOTAL KNEE ARTHROPLASTY.    Patient was given perioperative antibiotics:  Anti-infectives (From admission, onward)   Start     Dose/Rate Route Frequency Ordered Stop   08/26/18 1545  ceFAZolin (ANCEF) IVPB 2g/100 mL premix     2 g 200 mL/hr over 30 Minutes Intravenous Every 6 hours 08/26/18 1537 08/27/18 0036   08/26/18 0903  ceFAZolin (ANCEF) 2-4 GM/100ML-% IVPB    Note to Pharmacy:  Sara Diaz   : cabinet override      08/26/18 0903 08/26/18 1145   08/26/18 0900  ceFAZolin (ANCEF) IVPB 2g/100 mL premix     2 g 200 mL/hr over 30 Minutes Intravenous On call to O.R. 08/26/18 0857 08/26/18 1155       Patient was given sequential compression devices, early ambulation, and chemoprophylaxis to prevent DVT.  Patient benefited maximally from hospital stay and there were no complications.    Recent vital signs:  Patient Vitals for the past 24 hrs:  BP Temp Temp src Pulse Resp SpO2  08/28/18 0526 (!) 176/67 97.9 F (36.6 C) Oral 72 20 97 %  08/27/18 2021 (!) 171/71 98.2 F (36.8 C) Oral 67 20 97 %  08/27/18 1830 (!) 141/60 - - 73 - -  08/27/18 1619 (!) 141/60 97.9 F (36.6 C) Oral 73 18 95 %     Recent laboratory studies:  Recent Labs    08/27/18 0154 08/28/18 0223  WBC 11.3*  19.3*  HGB 12.1 11.0*  HCT 37.2 34.6*  PLT 185 221  NA 136 139  K 4.3 4.3  CL 105 109  CO2 23 25  BUN 11 15  CREATININE 0.91 1.01*  GLUCOSE 284* 215*  CALCIUM 8.7* 8.9     Discharge Medications:   Allergies as of 08/28/2018      Reactions   Contrast Media [iodinated Diagnostic Agents] Other (See Comments)   Convulsions,seizures    Etanercept Other (See Comments)   Excessive sweating   Sulfamethoxazole-trimethoprim Itching   Lisinopril Other (See Comments)   headaches   Nitrofurantoin Rash   'has red dye in it'   Canagliflozin Other (See Comments)   Constant yeast infection   Fluoxetine Other (See Comments)   Depression    Ioxaglate Other (See Comments)   Convulsions   Nabumetone Other (See Comments)   unknown   Tramadol Nausea And Vomiting   Adalimumab Rash   Metformin Other (See Comments)   GI upset   Olmesartan Nausea And Vomiting   Penicillins Rash   Has patient had a PCN reaction causing immediate rash, facial/tongue/throat swelling, SOB or lightheadedness with hypotension: Unknown Has patient had a PCN reaction causing severe rash involving mucus membranes or skin necrosis: Unknown Has patient had a PCN reaction that required hospitalization: Unknown Has patient had a PCN reaction occurring within the last 10 years: No If all of the above answers are "NO", then may proceed with Cephalosporin use.   Red Dye Rash   Rash in the face      Medication List    STOP taking these medications   estradiol 0.05 MG/24HR patch Commonly known as:  VIVELLE-DOT   ibuprofen 200 MG tablet Commonly known as:  ADVIL,MOTRIN   methotrexate 2.5 MG tablet Commonly known as:  RHEUMATREX   methylPREDNISolone 4 MG Tbpk tablet Commonly known as:  MEDROL DOSEPAK   RECLAST 5 MG/100ML Soln injection Generic drug:  zoledronic acid     TAKE these medications   amitriptyline 50 MG tablet Commonly known as:  ELAVIL Take 50 mg by mouth at bedtime.   apixaban 2.5 MG Tabs tablet Commonly known as:  ELIQUIS 1 tablet bid to prevent blood clots   aspirin-acetaminophen-caffeine 250-250-65 MG tablet Commonly known as:  EXCEDRIN MIGRAINE Take 2 tablets by mouth daily as needed for headache.   docusate sodium 100 MG capsule Commonly known as:  COLACE 1 tab 2 times a day while on narcotics.  STOOL SOFTENER   folic acid 627 MCG tablet Commonly known as:  FOLVITE Take 1,600 mcg by mouth daily.   gabapentin 300 MG capsule Commonly known as:  NEURONTIN 1 po qhs for nerve pain   HM VITAMIN D3 100 MCG (4000 UT) Caps Generic drug:  Cholecalciferol Take 4,000 Units by mouth daily.   imiquimod 5 % cream Commonly  known as:  ALDARA Apply 1 application topically 3 (three) times a week. As needed   LEVEMIR FLEXPEN 100 UNIT/ML Pen Generic drug:  Insulin Detemir Inject 30 Units into the skin every morning.   levothyroxine 112 MCG tablet Commonly known as:  SYNTHROID, LEVOTHROID Take 112 mcg by mouth at bedtime.   nadolol 80 MG tablet Commonly known as:  CORGARD Take 80 mg by mouth every evening.   omeprazole 40 MG capsule Commonly known as:  PRILOSEC Take 40 mg by mouth 2 (two) times daily.   ORENCIA 125 MG/ML Sosy Generic drug:  Abatacept Inject 125 mg into the skin every 7 (seven) days.  Saturdays   oxyCODONE 5 MG immediate release tablet Commonly known as:  Oxy IR/ROXICODONE 1 po q 4 hrs prn pain   polyethylene glycol packet Commonly known as:  MIRALAX / GLYCOLAX 17grams in [place] of something to drink twice a day until bowel movement.  LAXITIVE.  Restart if two days since last bowel movement   pravastatin 40 MG tablet Commonly known as:  PRAVACHOL Take 60 mg by mouth at bedtime. Takes 1.5 tablet   SYSTANE ULTRA PF 0.4-0.3 % Soln Generic drug:  Polyethyl Glyc-Propyl Glyc PF Place 1 drop into both eyes 4 (four) times daily as needed.   TRULICITY 1.5 JK/9.3OI Sopn Generic drug:  Dulaglutide Inject 1.5 mg into the skin every 7 (seven) days. Sunday            Discharge Care Instructions  (From admission, onward)         Start     Ordered   08/28/18 0000  Change dressing    Comments:  DO NOT REMOVE BANDAGE OVER SURGICAL INCISION.  [place] WHOLE LEG INCLUDING OVER THE WATERPROOF BANDAGE WITH SOAP AND WATER EVERY DAY.   08/28/18 0831          Diagnostic Studies: No results found.  Disposition: Discharge disposition: 01-Home or Self Care       Discharge Instructions    CPM   Complete by:  As directed    Continuous passive motion machine (CPM):      Use the CPM from 0 to 90 for 6 hours per day.       You may break it up into 2 or 3 sessions per day.      Use CPM for  2 weeks or until you are told to stop.   Call MD / Call 911   Complete by:  As directed    If you experience chest pain or shortness of breath, CALL 911 and be transported to the hospital emergency room.  If you develope a fever above 101 F, pus (white drainage) or increased drainage or redness at the wound, or calf pain, call your surgeon's office.   Change dressing   Complete by:  As directed    DO NOT REMOVE BANDAGE OVER SURGICAL INCISION.  [place] WHOLE LEG INCLUDING OVER THE WATERPROOF BANDAGE WITH SOAP AND WATER EVERY DAY.   Constipation Prevention   Complete by:  As directed    Drink plenty of fluids.  Prune juice may be helpful.  You may use a stool softener, such as Colace (over the counter) 100 mg twice a day.  Use MiraLax (over the counter) for constipation as needed.   Diet - low sodium heart healthy   Complete by:  As directed    Discharge instructions   Complete by:  As directed    INSTRUCTIONS AFTER JOINT REPLACEMENT   Remove items at home which could result in a fall. This includes throw rugs or furniture in walking pathways ICE to the affected joint every three hours while awake for 30 minutes at a time, for at least the first 3-5 days, and then as needed for pain and swelling.  Continue to use ice for pain and swelling. You may notice swelling that will progress down to the foot and ankle.  This is normal after surgery.  Elevate your leg when you are not up walking on it.   Continue to use the breathing machine you got in the hospital (incentive spirometer) which will help keep your temperature down.  It  is common for your temperature to cycle up and down following surgery, especially at night when you are not up moving around and exerting yourself.  The breathing machine keeps your lungs expanded and your temperature down.   DIET:  As you were doing prior to hospitalization, we recommend a well-balanced diet.  DRESSING / WOUND CARE / SHOWERING  Keep the surgical dressing  until follow up.  The dressing is water proof, so you can shower without any extra covering.  IF THE DRESSING FALLS OFF or the wound gets wet inside, change the dressing with sterile gauze.  Please use good hand washing techniques before changing the dressing.  Do not use any lotions or creams on the incision until instructed by your surgeon.    ACTIVITY  Increase activity slowly as tolerated, but follow the weight bearing instructions below.   No driving for 6 weeks or until further direction given by your physician.  You cannot drive while taking narcotics.  No lifting or carrying greater than 10 lbs. until further directed by your surgeon. Avoid periods of inactivity such as sitting longer than an hour when not asleep. This helps prevent blood clots.  You may return to work once you are authorized by your doctor.     WEIGHT BEARING   Weight bearing as tolerated with assist device (walker, cane, etc) as directed, use it as long as suggested by your surgeon or therapist, typically at least 2-3 weeks.   EXERCISES  Results after joint replacement surgery are often greatly improved when you follow the exercise, range of motion and muscle strengthening exercises prescribed by your doctor. Safety measures are also important to protect the joint from further injury. Any time any of these exercises cause you to have increased pain or swelling, decrease what you are doing until you are comfortable again and then slowly increase them. If you have problems or questions, call your caregiver or physical therapist for advice.   Rehabilitation is important following a joint replacement. After just a few days of immobilization, the muscles of the leg can become weakened and shrink (atrophy).  These exercises are designed to build up the tone and strength of the thigh and leg muscles and to improve motion. Often times heat used for twenty to thirty minutes before working out will loosen up your tissues and  help with improving the range of motion but do not use heat for the first two weeks following surgery (sometimes heat can increase post-operative swelling).   These exercises can be done on a training (exercise) mat, on the floor, on a table or on a bed. Use whatever works the best and is most comfortable for you.    Use music or television while you are exercising so that the exercises are a pleasant break in your day. This will make your life better with the exercises acting as a break in your routine that you can look forward to.   Perform all exercises about fifteen times, three times per day or as directed.  You should exercise both the operative leg and the other leg as well.   Exercises include:  Quad Sets - Tighten up the muscle on the front of the thigh (Quad) and hold for 5-10 seconds.   Straight Leg Raises - With your knee straight (if you were given a brace, keep it on), lift the leg to 60 degrees, hold for 3 seconds, and slowly lower the leg.  Perform this exercise against resistance later as your  leg gets stronger.  Leg Slides: Lying on your back, slowly slide your foot toward your buttocks, bending your knee up off the floor (only go as far as is comfortable). Then slowly slide your foot back down until your leg is flat on the floor again.  Angel Wings: Lying on your back spread your legs to the side as far apart as you can without causing discomfort.  Hamstring Strength:  Lying on your back, push your heel against the floor with your leg straight by tightening up the muscles of your buttocks.  Repeat, but this time bend your knee to a comfortable angle, and push your heel against the floor.  You may put a pillow under the heel to make it more comfortable if necessary.   A rehabilitation program following joint replacement surgery can speed recovery and prevent re-injury in the future due to weakened muscles. Contact your doctor or a physical therapist for more information on knee  rehabilitation.    CONSTIPATION  Constipation is defined medically as fewer than three stools per week and severe constipation as less than one stool per week.  Even if you have a regular bowel pattern at home, your normal regimen is likely to be disrupted due to multiple reasons following surgery.  Combination of anesthesia, postoperative narcotics, change in appetite and fluid intake all can affect your bowels.   YOU MUST use at least one of the following options; they are listed in order of increasing strength to get the job done.  They are all available over the counter, and you may need to use some, POSSIBLY even all of these options:    Drink plenty of fluids (prune juice may be helpful) and high fiber foods Colace 100 mg by mouth twice a day  Senokot for constipation as directed and as needed Dulcolax (bisacodyl), take with full glass of water  Miralax (polyethylene glycol) once or twice a day as needed.  If you have tried all these things and are unable to have a bowel movement in the first 3-4 days after surgery call either your surgeon or your primary doctor.    If you experience loose stools or diarrhea, hold the medications until you stool forms back up.  If your symptoms do not get better within 1 week or if they get worse, check with your doctor.  If you experience "the worst abdominal pain ever" or develop nausea or vomiting, please contact the office immediately for further recommendations for treatment.   ITCHING:  If you experience itching with your medications, try taking only a single pain pill, or even half a pain pill at a time.  You can also use Benadryl over the counter for itching or also to help with sleep.   TED HOSE STOCKINGS:  Use stockings on both legs until for at least 2 weeks or as directed by physician office. They may be removed at night for sleeping.  MEDICATIONS:  See your medication summary on the "After Visit Summary" that nursing will review with you.   You may have some home medications which will be placed on hold until you complete the course of blood thinner medication.  It is important for you to complete the blood thinner medication as prescribed.  PRECAUTIONS:  If you experience chest pain or shortness of breath - call 911 immediately for transfer to the hospital emergency department.   If you develop a fever greater that 101 F, purulent drainage from wound, increased redness or drainage from wound,  foul odor from the wound/dressing, or calf pain - CONTACT YOUR SURGEON.                                                   FOLLOW-UP APPOINTMENTS:  If you do not already have a post-op appointment, please call the office for an appointment to be seen by your surgeon.  Guidelines for how soon to be seen are listed in your "After Visit Summary", but are typically between 1-4 weeks after surgery.  OTHER INSTRUCTIONS:   Knee Replacement:  Do not place pillow under knee, focus on keeping the knee straight while resting. CPM instructions: 0-90 degrees, 2 hours in the morning, 2 hours in the afternoon, and 2 hours in the evening. Place foam block, curve side up under heel at all times except when in CPM or when walking.  DO NOT modify, tear, cut, or change the foam block in any way.  MAKE SURE YOU:  Understand these instructions.  Get help right away if you are not doing well or get worse.    Thank you for letting us be a part of your medical care team.  It is a privilege we respect greatly.  We hope these instructions will help you stay on track for a fast and full recovery!   Do not put a pillow under the knee. Place it under the heel.   Complete by:  As directed    Place gray foam block, curve side up under heel at all times except when in CPM or when walking.  DO NOT modify, tear, cut, or change in any way the gray foam block.   Increase activity slowly as tolerated   Complete by:  As directed    Patient may shower   Complete by:  As directed     Aquacel dressing is water proof    Wash over it and the whole leg with soap and water at the end of your shower   TED hose   Complete by:  As directed    Use stockings (TED hose) for 2 weeks on both leg(s).  You may remove them at night for sleeping.      Follow-up Information    [facility].   Specialty:  [place] Why:  [facility] will call to arrange initial visit Contact information: [place] [address] [phone]        Elsie Saas, MD Follow up on 09/09/2018.   Specialty:  Orthopedic Surgery Why:  arrive at 1:30 for 2 pm physical therapy and see Dr Noemi Chapel at 3 pm Contact information: [place]. [address] [address] [phone]            Signed: Linda Hedges 08/28/2018, 8:51 AM

## 2018-08-28 NOTE — Progress Notes (Signed)
Handoff report given to Cynthia, RN.

## 2018-08-28 NOTE — Progress Notes (Signed)
Patient awaiting husband to come to pick her up for discharge. States husband will be here in about an hour. Patient declines additional PT session.

## 2018-08-28 NOTE — Plan of Care (Signed)
  Problem: Education: Goal: Knowledge of General Education information will improve Description Including pain rating scale, medication(s)/side effects and non-pharmacologic comfort measures Outcome: Progressing Note:  POC and pain management reviewed with pt.   

## 2018-08-28 NOTE — Progress Notes (Signed)
[id number] and Kirstin Shepperson, PA called and informed; orders given.

## 2018-08-28 NOTE — Progress Notes (Signed)
Physical Therapy Treatment Patient Details Name: Sara Diaz MRN: [id number] DOB: [birth year] Today's Date: 08/28/2018    History of Present Illness Pt is 72 y.o. female s/p elective L TKA (08/27/18) secondary to osteoarthritis. PMH significant for Type II diabetes mellitus, RA, osteoporosis, HTN, R knee arthroscopy, cervical fusion, arthritis, depression, neurmuscular disorder and dyspnea.       PT Comments    Pt reports increased pain today, but did not limit ability to participate in therapy. She completed stair training hands on min guard for safety, with occasional MinA for stability during ascent of 3 steps and use of both rails. She feels able to navigate stairs at her home entrance with assistance of husband. Pt ambulating with step-to pattern and improved endurance as noted by increased distance. DC plan remains appropriate at this time, PT will see one more time this afternoon prior to DC home.   Follow Up Recommendations  Follow surgeon's recommendation for DC plan and follow-up therapies     Equipment Recommendations  None recommended by PT       Precautions / Restrictions Precautions Precautions: Fall Restrictions Weight Bearing Restrictions: Yes LLE Weight Bearing: Weight bearing as tolerated    Mobility  Bed Mobility Overal bed mobility: Needs Assistance Bed Mobility: Supine to Sit     Supine to sit: Min guard;HOB elevated     General bed mobility comments: min guard for safety, used bedrail  Transfers Overall transfer level: Needs assistance Equipment used: Rolling walker (2 wheeled) Transfers: Sit to/from Stand Sit to Stand: Min guard         General transfer comment: min guard for safety, vc to go slower, taking time between sitting up and standing up.   Ambulation/Gait Ambulation/Gait assistance: Min guard Gait Distance (Feet): 140 Feet Assistive device: Rolling walker (2 wheeled) Gait Pattern/deviations: Step-to pattern;Decreased stance  time - left Gait velocity: slowed Gait velocity interpretation: <1.8 ft/sec, indicate of risk for recurrent falls General Gait Details: hands on min guard for safety, vc to look up, pt ambulating slower today with step-to pattern due to increased pain. Assisted pt to bathroom at start of treatment, with momentary LOB with initial step due to increased pain. No further LOB noted for duration of session. Pt educated on importance of pain management for continued mobility.   Stairs Stairs: Yes Stairs assistance: Min guard;Min assist Stair Management: Two rails;Step to pattern Number of Stairs: 3 General stair comments: Pt hands on min guard for safety with occasional minA for balance and stability during stepping up. Pt completed 3 stairs three times with use of bilateral UE support on rails, and reports husband will be able to help her for 1 1/2 steps into home.        Balance Overall balance assessment: Needs assistance Sitting-balance support: Feet supported Sitting balance-Leahy Scale: Good     Standing balance support: Bilateral upper extremity supported Standing balance-Leahy Scale: Fair                              Cognition Arousal/Alertness: Awake/alert Behavior During Therapy: WFL for tasks assessed/performed Overall Cognitive Status: Within Functional Limits for tasks assessed                                           General Comments General comments (skin integrity, edema, etc.): Some edema present  at incisional site, decreased from yesterday. She was using her CPM at beginning of session.      Pertinent Vitals/Pain Pain Assessment: Faces Faces Pain Scale: Hurts even more Pain Location: left knee Pain Descriptors / Indicators: Grimacing;Aching;Sore;Guarding Pain Intervention(s): Limited activity within patient's tolerance;Monitored during session;Premedicated before session;Repositioned           PT Goals (current goals can now be  found in the care plan section) Acute Rehab PT Goals Patient Stated Goal: get up and walk every 30 minutes Progress towards PT goals: Progressing toward goals    Frequency    7X/week      PT Plan Current plan remains appropriate       AM-PAC PT "6 Clicks" Daily Activity  Outcome Measure  Difficulty turning over in bed (including adjusting bedclothes, sheets and blankets)?: A Little Difficulty moving from lying on back to sitting on the side of the bed? : Unable Difficulty sitting down on and standing up from a chair with arms (e.g., wheelchair, bedside commode, etc,.)?: Unable Help needed moving to and from a bed to chair (including a wheelchair)?: A Little Help needed walking in hospital room?: A Little Help needed climbing 3-5 steps with a railing? : A Little 6 Click Score: 14    End of Session Equipment Utilized During Treatment: Gait belt Activity Tolerance: Patient tolerated treatment well Patient left: in chair;with call bell/phone within reach;with chair alarm set   PT Visit Diagnosis: Other abnormalities of gait and mobility (R26.89);Difficulty in walking, not elsewhere classified (R26.2);Pain Pain - Right/Left: Left Pain - part of body: Knee     Time: 0927-0956 PT Time Calculation (min) (ACUTE ONLY): 29 min  Charges:  $Gait Training: 23-37 mins                     Vernell Morgans, SPT Acute Rehabilitation Services Office [phone]    Vernell Morgans 08/28/2018, 12:40 PM

## 2018-08-28 NOTE — Progress Notes (Signed)
Written and verbal discharge instructions given to the patient.  The patient verbalizes understanding those instructions provided.  The patient has scheduled follow up appointment and will be seen by Home Health.Marland Kitchen

## 2018-12-12 ENCOUNTER — Other Ambulatory Visit: Payer: Self-pay

## 2018-12-12 DIAGNOSIS — I83892 Varicose veins of left lower extremities with other complications: Secondary | ICD-10-CM

## 2018-12-31 ENCOUNTER — Ambulatory Visit (INDEPENDENT_AMBULATORY_CARE_PROVIDER_SITE_OTHER): Payer: Medicare HMO | Admitting: Vascular Surgery

## 2018-12-31 ENCOUNTER — Ambulatory Visit (HOSPITAL_COMMUNITY)
Admission: RE | Admit: 2018-12-31 | Discharge: 2018-12-31 | Disposition: A | Discharge status: Home / Self Care | Payer: Medicare HMO | Source: Ambulatory Visit | Attending: Family | Admitting: Family

## 2018-12-31 ENCOUNTER — Encounter: Payer: Self-pay | Admitting: Vascular Surgery

## 2018-12-31 ENCOUNTER — Other Ambulatory Visit: Payer: Self-pay

## 2018-12-31 VITALS — BP 152/92 | HR 74 | Temp 97.5°F | Resp 20 | Ht 67.0 in | Wt 169.7 lb

## 2018-12-31 DIAGNOSIS — M7989 Other specified soft tissue disorders: Secondary | ICD-10-CM | POA: Diagnosis not present

## 2018-12-31 DIAGNOSIS — I83892 Varicose veins of left lower extremities with other complications: Secondary | ICD-10-CM | POA: Insufficient documentation

## 2018-12-31 NOTE — Progress Notes (Signed)
[facility] [place]  Patient name: Sara Diaz MRN: [id number] DOB: [birth year] Sex: female  REASON FOR CONSULT: Valuation lower extremity venous pathology  HPI: Sara Diaz is a 73 y.o. female, who is here today with her husband for evaluation of lower extremity swelling.  She reports this is bilaterally present.  Feels that this is equal right and left.  Reports this is progressive in her knee distally to her ankle.  Occasionally has some swelling in her feet but does not go into her toes.  Did have a total knee replacement on the left in November 2019.  She does have a history of DVT.  No history of venous ulceration.  Past Medical History:  Diagnosis Date  . Arthritis    "neck ,hands, back" (05/17/2017)  . Barrett esophagus   . Complication of anesthesia nausea and vomiting   . Depression   . DVT (deep vein thrombosis) in pregnancy 1973   BLE; "I had a 10# pregnancy"  . Dyspnea    with exertion  . GERD (gastroesophageal reflux disease)    off prilosec .developed cough .thought to be allergic  to prilosec  . Headache(784.0)    "stopped having them daily after back OR in 2012; restarted in 2017; had 1-2/month" (05/17/2017)  . High cholesterol   . History of kidney stones 2006,2007  . Hypertension   . Hypothyroidism   . Neuromuscular disorder (HCC)    weakness r leg.  . Osteoporosis   . Pneumonia 2015-2016 X 2  . PONV (postoperative nausea and vomiting)   . Pulmonary embolism ([place]) 1973 X 2  . RA (rheumatoid arthritis) ([place])   . Type II diabetes mellitus ([place])    Type II    History reviewed. No pertinent family history.  SOCIAL HISTORY: Social History   Socioeconomic History  . Marital status: Married    Spouse name: Not on file  . Number of children: Not on file  . Years of education: Not on file  . Highest education level: Not on file  Occupational History  . Not on file  Social Needs  . Financial  resource strain: Not on file  . Food insecurity:    Worry: Not on file    Inability: Not on file  . Transportation needs:    Medical: Not on file    Non-medical: Not on file  Tobacco Use  . Smoking status: Never Smoker  . Smokeless tobacco: Never Used  Substance and Sexual Activity  . Alcohol use: No  . Drug use: No  . Sexual activity: Not Currently    Birth control/protection: Post-menopausal  Lifestyle  . Physical activity:    Days per week: Not on file    Minutes per session: Not on file  . Stress: Not on file  Relationships  . Social connections:    Talks on phone: Not on file    Gets together: Not on file    Attends religious service: Not on file    Active member of club or organization: Not on file    Attends meetings of clubs or organizations: Not on file    Relationship status: Not on file  . Intimate partner violence:    Fear of current or ex partner: Not on file    Emotionally abused: Not on file    Physically abused: Not on file    Forced sexual activity: Not on file  Other Topics Concern  . Not on file  Social History  Narrative  . Not on file    Allergies  Allergen Reactions  . Etanercept Other (See Comments)    Excessive sweating  . Iodinated Diagnostic Agents Other (See Comments), Anaphylaxis and Swelling    Convulsions,seizures  Seizures Seizures   . Sulfamethoxazole-Trimethoprim Itching  . Lisinopril Other (See Comments)    headaches  . Nitrofurantoin Rash    'has red dye in it'  . Canagliflozin Other (See Comments)    Constant yeast infection  . Fluoxetine Other (See Comments)    Depression  . Ioxaglate Other (See Comments)    Convulsions  . Nabumetone Other (See Comments)    unknown  . Tramadol Nausea And Vomiting  . Adalimumab Rash  . Metformin Other (See Comments)    GI upset  . Olmesartan Nausea And Vomiting  . Penicillins Rash    Has patient had a PCN reaction causing immediate rash, facial/tongue/throat swelling, SOB or  lightheadedness with hypotension: Unknown Has patient had a PCN reaction causing severe rash involving mucus membranes or skin necrosis: Unknown Has patient had a PCN reaction that required hospitalization: Unknown Has patient had a PCN reaction occurring within the last 10 years: No If all of the above answers are "NO", then may proceed with Cephalosporin use.   . Red Dye Rash    Rash in the face    Current Outpatient Medications  Medication Sig Dispense Refill  . Abatacept (ORENCIA) 125 MG/ML SOSY Inject 125 mg into the skin every 7 (seven) days. Saturdays    . amitriptyline (ELAVIL) 50 MG tablet Take 50 mg by mouth at bedtime.      Marland Kitchen apixaban (ELIQUIS) 2.5 MG TABS tablet 1 tablet bid to prevent blood clots 60 tablet 0  . aspirin-acetaminophen-caffeine (EXCEDRIN MIGRAINE) 250-250-65 MG tablet Take 2 tablets by mouth daily as needed for headache.     . Cholecalciferol (HM VITAMIN D3) 4000 UNITS CAPS Take 4,000 Units by mouth daily.      Marland Kitchen docusate sodium (COLACE) 100 MG capsule 1 tab 2 times a day while on narcotics.  STOOL SOFTENER 60 capsule 0  . Dulaglutide (TRULICITY) 1.5 YQ/0.3KV SOPN Inject 1.5 mg into the skin every 7 (seven) days. Sunday    . folic acid (FOLVITE) 425 MCG tablet Take 1,600 mcg by mouth daily.    . imiquimod (ALDARA) 5 % cream Apply 1 application topically 3 (three) times a week. As needed    . Insulin Detemir (LEVEMIR FLEXPEN) 100 UNIT/ML Pen Inject 30 Units into the skin every morning.     Marland Kitchen levothyroxine (SYNTHROID, LEVOTHROID) 112 MCG tablet Take 112 mcg by mouth at bedtime.     . nadolol (CORGARD) 80 MG tablet Take 80 mg by mouth every evening.     Marland Kitchen omeprazole (PRILOSEC) 40 MG capsule Take 40 mg by mouth 2 (two) times daily.    Vladimir Faster Glyc-Propyl Glyc PF (SYSTANE ULTRA PF) 0.4-0.3 % SOLN Place 1 drop into both eyes 4 (four) times daily as needed.    . polyethylene glycol (MIRALAX / GLYCOLAX) packet 17grams in [place] of something to drink twice a day until  bowel movement.  LAXITIVE.  Restart if two days since last bowel movement 14 each 0  . pravastatin (PRAVACHOL) 40 MG tablet Take 60 mg by mouth at bedtime. Takes 1.5 tablet     No current facility-administered medications for this visit.     REVIEW OF SYSTEMS:  [X]  denotes positive finding, [ ]  denotes negative finding Cardiac  Comments:  Chest  pain or chest pressure:    Shortness of breath upon exertion:    Short of breath when lying flat:    Irregular heart rhythm:        Vascular    Pain in calf, thigh, or hip brought on by ambulation: x   Pain in feet at night that wakes you up from your sleep:     Blood clot in your veins:    Leg swelling:  x       Pulmonary    Oxygen at home:    Productive cough:     Wheezing:         Neurologic    Sudden weakness in arms or legs:     Sudden numbness in arms or legs:     Sudden onset of difficulty speaking or slurred speech:    Temporary loss of vision in one eye:     Problems with dizziness:         Gastrointestinal    Blood in stool:     Vomited blood:         Genitourinary    Burning when urinating:     Blood in urine:        Psychiatric    Major depression:         Hematologic    Bleeding problems:    Problems with blood clotting too easily:        Skin    Rashes or ulcers:        Constitutional    Fever or chills:      PHYSICAL EXAM: Vitals:   12/31/18 1046  BP: (!) 152/92  Pulse: 74  Resp: 20  Temp: (!) 97.5 F (36.4 C)  SpO2: 98%  Weight: 169 lb 11.2 oz (77 kg)  Height: 5\' 7"  (1.702 m)    GENERAL: The patient is a well-nourished female, in no acute distress. The vital signs are documented above. CARDIOVASCULAR: 2+ radial and 2+ dorsalis pedis pulses bilaterally.  Moderate swelling from her knees down to her ankles with no pitting edema PULMONARY: There is good air exchange  ABDOMEN: Soft and non-tender  MUSCULOSKELETAL: There are no major deformities or cyanosis. NEUROLOGIC: No focal weakness or  paresthesias are detected. SKIN: There are no ulcers or rashes noted. PSYCHIATRIC: The patient has a normal affect.  DATA:  Noninvasive studies revealed no evidence of DVT and mild reflux at the saphenofemoral junction with no significant dilatation of her saphenous vein left leg  MEDICAL ISSUES: I discussed this with the patient and her husband.  I do not see any evidence of correctable venous hypertension.  I did explain the importance of graduated compression garments which she has tried and has been successful with in the past.  I feel that knee-high 20 to 30 mmHg is appropriate.  She has a difficult time with elevation due to her reflux and Barrett's esophagus.  She was reassured with this discussion will see [place] again on an as-needed basis   Rosetta Posner, MD [facility] Tel [phone] Pager [phone]

## 2019-08-18 ENCOUNTER — Other Ambulatory Visit (HOSPITAL_COMMUNITY): Payer: Self-pay | Admitting: *Deleted

## 2019-08-19 ENCOUNTER — Ambulatory Visit (HOSPITAL_COMMUNITY)
Admission: RE | Admit: 2019-08-19 | Discharge: 2019-08-19 | Disposition: A | Discharge status: Home / Self Care | Payer: Medicare HMO | Source: Ambulatory Visit | Attending: Endocrinology | Admitting: Endocrinology

## 2019-08-19 ENCOUNTER — Other Ambulatory Visit: Payer: Self-pay

## 2019-08-19 DIAGNOSIS — M810 Age-related osteoporosis without current pathological fracture: Secondary | ICD-10-CM | POA: Diagnosis present

## 2019-08-19 MED ORDER — ZOLEDRONIC ACID 5 MG/100ML IV SOLN
5.0000 mg | Freq: Once | INTRAVENOUS | Status: AC
  Administered 2019-08-19: 11:00:00 5 mg via INTRAVENOUS

## 2019-08-19 MED ORDER — ZOLEDRONIC ACID 5 MG/100ML IV SOLN
INTRAVENOUS | Status: AC
  Filled 2019-08-19: qty 100

## 2020-03-02 ENCOUNTER — Encounter (HOSPITAL_BASED_OUTPATIENT_CLINIC_OR_DEPARTMENT_OTHER): Payer: Self-pay | Admitting: *Deleted

## 2020-03-02 ENCOUNTER — Other Ambulatory Visit: Payer: Self-pay

## 2020-03-02 ENCOUNTER — Emergency Department (HOSPITAL_BASED_OUTPATIENT_CLINIC_OR_DEPARTMENT_OTHER): Payer: Medicare HMO

## 2020-03-02 ENCOUNTER — Emergency Department (HOSPITAL_BASED_OUTPATIENT_CLINIC_OR_DEPARTMENT_OTHER)
Admission: EM | Admit: 2020-03-02 | Discharge: 2020-03-02 | Disposition: A | Discharge status: Home / Self Care | Payer: Medicare HMO | Attending: Emergency Medicine | Admitting: Emergency Medicine

## 2020-03-02 DIAGNOSIS — Z79899 Other long term (current) drug therapy: Secondary | ICD-10-CM | POA: Insufficient documentation

## 2020-03-02 DIAGNOSIS — Y9301 Activity, walking, marching and hiking: Secondary | ICD-10-CM | POA: Diagnosis not present

## 2020-03-02 DIAGNOSIS — W19XXXA Unspecified fall, initial encounter: Secondary | ICD-10-CM

## 2020-03-02 DIAGNOSIS — S0292XA Unspecified fracture of facial bones, initial encounter for closed fracture: Secondary | ICD-10-CM

## 2020-03-02 DIAGNOSIS — Z794 Long term (current) use of insulin: Secondary | ICD-10-CM | POA: Diagnosis not present

## 2020-03-02 DIAGNOSIS — I10 Essential (primary) hypertension: Secondary | ICD-10-CM | POA: Insufficient documentation

## 2020-03-02 DIAGNOSIS — Z86718 Personal history of other venous thrombosis and embolism: Secondary | ICD-10-CM | POA: Diagnosis not present

## 2020-03-02 DIAGNOSIS — S0240CA Maxillary fracture, right side, initial encounter for closed fracture: Secondary | ICD-10-CM | POA: Diagnosis not present

## 2020-03-02 DIAGNOSIS — S0993XA Unspecified injury of face, initial encounter: Secondary | ICD-10-CM | POA: Diagnosis present

## 2020-03-02 DIAGNOSIS — Y9289 Other specified places as the place of occurrence of the external cause: Secondary | ICD-10-CM | POA: Insufficient documentation

## 2020-03-02 DIAGNOSIS — W101XXA Fall (on)(from) sidewalk curb, initial encounter: Secondary | ICD-10-CM | POA: Insufficient documentation

## 2020-03-02 DIAGNOSIS — M79641 Pain in right hand: Secondary | ICD-10-CM

## 2020-03-02 DIAGNOSIS — Y999 Unspecified external cause status: Secondary | ICD-10-CM | POA: Insufficient documentation

## 2020-03-02 DIAGNOSIS — S52591A Other fractures of lower end of right radius, initial encounter for closed fracture: Secondary | ICD-10-CM | POA: Insufficient documentation

## 2020-03-02 DIAGNOSIS — E039 Hypothyroidism, unspecified: Secondary | ICD-10-CM | POA: Insufficient documentation

## 2020-03-02 DIAGNOSIS — R030 Elevated blood-pressure reading, without diagnosis of hypertension: Secondary | ICD-10-CM

## 2020-03-02 DIAGNOSIS — S51011A Laceration without foreign body of right elbow, initial encounter: Secondary | ICD-10-CM | POA: Diagnosis not present

## 2020-03-02 DIAGNOSIS — E119 Type 2 diabetes mellitus without complications: Secondary | ICD-10-CM | POA: Diagnosis not present

## 2020-03-02 DIAGNOSIS — Z23 Encounter for immunization: Secondary | ICD-10-CM | POA: Diagnosis not present

## 2020-03-02 DIAGNOSIS — S52501A Unspecified fracture of the lower end of right radius, initial encounter for closed fracture: Secondary | ICD-10-CM

## 2020-03-02 MED ORDER — ACETAMINOPHEN 325 MG PO TABS
650.0000 mg | ORAL_TABLET | Freq: Once | ORAL | Status: AC
  Administered 2020-03-02: 650 mg via ORAL
  Filled 2020-03-02: qty 2

## 2020-03-02 MED ORDER — HYDROCODONE-ACETAMINOPHEN 5-325 MG PO TABS: 1.0000 | ORAL_TABLET | Freq: Three times a day (TID) | ORAL | 0 refills | Status: AC | PRN

## 2020-03-02 MED ORDER — KETOROLAC TROMETHAMINE 30 MG/ML IJ SOLN
30.0000 mg | Freq: Once | INTRAMUSCULAR | Status: AC
  Administered 2020-03-02: 30 mg via INTRAMUSCULAR
  Filled 2020-03-02: qty 1

## 2020-03-02 MED ORDER — ONDANSETRON 4 MG PO TBDP
4.0000 mg | ORAL_TABLET | Freq: Once | ORAL | Status: AC
  Administered 2020-03-02: 4 mg via ORAL
  Filled 2020-03-02: qty 1

## 2020-03-02 MED ORDER — ACETAMINOPHEN ER 650 MG PO TBCR: 650.0000 mg | EXTENDED_RELEASE_TABLET | Freq: Three times a day (TID) | ORAL | 0 refills | Status: AC | PRN

## 2020-03-02 MED ORDER — TETANUS-DIPHTH-ACELL PERTUSSIS 5-2.5-18.5 LF-MCG/0.5 IM SUSP
0.5000 mL | Freq: Once | INTRAMUSCULAR | Status: AC
  Administered 2020-03-02: 0.5 mL via INTRAMUSCULAR
  Filled 2020-03-02: qty 0.5

## 2020-03-02 MED ORDER — CEPHALEXIN 500 MG PO CAPS
500.0000 mg | ORAL_CAPSULE | Freq: Four times a day (QID) | ORAL | 0 refills | Status: DC
Start: 2020-03-02 — End: 2020-04-19

## 2020-03-02 MED ORDER — LIDOCAINE-EPINEPHRINE (PF) 2 %-1:200000 IJ SOLN
10.0000 mL | Freq: Once | INTRAMUSCULAR | Status: AC
  Administered 2020-03-02: 10 mL
  Filled 2020-03-02: qty 10

## 2020-03-02 MED FILL — CEPHALEXIN 500 MG CAPSULE: 500 | 5 days supply | Qty: 20 | Fill #0

## 2020-03-02 MED FILL — HYDROCODON-APAP 5-325: 5-325 | 3 days supply | Qty: 9 | Fill #0

## 2020-03-02 NOTE — ED Triage Notes (Signed)
She tripped and fell. Laceration to her right eyebrow. No LOC. She is a diabetic. She vomited after the fall. She feels she needs to eat.

## 2020-03-02 NOTE — ED Provider Notes (Signed)
Physical Exam  BP (!) 211/94 (BP Location: Right Arm)   Pulse 70   Temp 98.3 F (36.8 C) (Oral)   Resp 19   Ht 5\' 7"  (1.702 m)   Wt 74.8 kg   SpO2 97%   BMI 25.84 kg/m   Physical Exam Vitals and nursing note reviewed.  Constitutional:      General: She is not in acute distress.    Appearance: Normal appearance. She is well-developed. She is not ill-appearing.     Comments: Calm and cooperative. NAD  HENT:     Head: Normocephalic. Raccoon eyes (right side) and laceration (~2cm vertical laceration over the R eyebrow) present.  Eyes:     General: No scleral icterus.       Right eye: No discharge.        Left eye: No discharge.     Conjunctiva/sclera: Conjunctivae normal.     Pupils: Pupils are equal, round, and reactive to light.  Cardiovascular:     Rate and Rhythm: Normal rate.  Pulmonary:     Effort: Pulmonary effort is normal. No respiratory distress.  Abdominal:     General: There is no distension.  Musculoskeletal:     Cervical back: Normal range of motion.  Skin:    General: Skin is warm and dry.  Neurological:     Mental Status: She is alert and oriented to person, place, and time.  Psychiatric:        Behavior: Behavior normal.     ED Course/Procedures     .Marland KitchenLaceration Repair  Date/Time: 03/02/2020 2:16 PM Performed by: Recardo Evangelist, PA-C Authorized by: Recardo Evangelist, PA-C   Consent:    Consent obtained:  Verbal   Consent given by:  Patient   Risks discussed:  Infection and pain   Alternatives discussed:  No treatment Anesthesia (see MAR for exact dosages):    Anesthesia method:  Local infiltration   Local anesthetic:  Lidocaine 2% WITH epi Laceration details:    Location:  Face   Face location:  R eyebrow   Length (cm):  2   Depth (mm):  3 Repair type:    Repair type:  Simple Pre-procedure details:    Preparation:  Patient was prepped and draped in usual sterile fashion Exploration:    Wound exploration: wound explored through  full range of motion and entire depth of wound probed and visualized     Wound extent: no underlying fracture noted   Treatment:    Area cleansed with:  Shur-Clens   Amount of cleaning:  Standard   Irrigation method:  Tap   Visualized foreign bodies/material removed: no   Skin repair:    Repair method:  Sutures   Suture size:  6-0   Suture material:  Prolene   Suture technique:  Simple interrupted   Number of sutures:  4 Approximation:    Approximation:  Close Post-procedure details:    Dressing:  Antibiotic ointment and sterile dressing   Patient tolerance of procedure:  Tolerated well, no immediate complications    MDM   74 year old female presents with trip and fall and laceration over the right eyebrow.  Patient is preferring area to be sutured.  4 Prolene sutures were placed and wound was well approximated.  She tolerated it well.  Wound care was discussed and advised to have stitches removed in 5 to 7 days       Recardo Evangelist, PA-C 03/02/20 [place], [place], MD 03/04/20 [phone]

## 2020-03-02 NOTE — Discharge Instructions (Signed)
We signed the ER for your fall.  You have orbital floor fracture/facial fracture.  Please take the antibiotics prescribed.  Follow-up with the ENT doctors for this injury.  Please make sure you are not blowing your nose.  Additionally, you also have possible fracture to your wrist.  Follow-up with the hand surgeon in 7 to 10 days for this injury.  Finally, your blood pressure was noted to be elevated.  This could be because of pain -however, if you blood pressure remains high then your primary care doctor should also see you.

## 2020-03-02 NOTE — ED Notes (Signed)
Pt ambulated to restroom with a steady gait noted. Standby assistance provided.

## 2020-03-02 NOTE — ED Notes (Signed)
Patient transported to CT 

## 2020-03-02 NOTE — ED Provider Notes (Signed)
[facility] EMERGENCY DEPARTMENT Provider Note   CSN: [id number] Arrival date & time: 03/02/20  1110     History Chief Complaint  Patient presents with  . Fall  . Laceration    Sara Diaz is a 75 y.o. female.  HPI     74 year old female comes in a chief complaint of fall. Patient has no significant medical history. She reports that she was turning, tripped over the curb side and fell onto concrete. She struck the left side of her face onto concrete. No loss of consciousness. Patient denies any numbness, tingling, neck pain. She is primarily having pain over her forehead, right side of the face. Pt has no associated nausea, vomiting, seizures, loss of consciousness or new visual complains, weakness, numbness, dizziness or gait instability. Patient also complains of pain over her right hand/wrist and left knee. She is not on any blood thinners.  Past Medical History:  Diagnosis Date  . Arthritis    "neck ,hands, back" (05/17/2017)  . [facility] esophagus   . Complication of anesthesia nausea and vomiting   . Depression   . DVT (deep vein thrombosis) in pregnancy 1973   BLE; "I had a 10# pregnancy"  . Dyspnea    with exertion  . GERD (gastroesophageal reflux disease)    off prilosec .developed cough .thought to be allergic  to prilosec  . Headache(784.0)    "stopped having them daily after back OR in 2012; restarted in 2017; had 1-2/month" (05/17/2017)  . High cholesterol   . History of kidney stones 2006,2007  . Hypertension   . Hypothyroidism   . Neuromuscular disorder (HCC)    weakness r leg.  . Osteoporosis   . Pneumonia 2015-2016 X 2  . PONV (postoperative nausea and vomiting)   . Pulmonary embolism ([place]) 1973 X 2  . RA (rheumatoid arthritis) ([place])   . Type II diabetes mellitus ([place])    Type II    Patient Active Problem List   Diagnosis Date Noted  . Primary localized osteoarthritis of left knee 08/26/2018  . Type II diabetes mellitus ([place])   . RA  (rheumatoid arthritis) ([place])   . Pulmonary embolism ([place])   . PONV (postoperative nausea and vomiting)   . Neuromuscular disorder ([place])   . Hypothyroidism   . Hypertension   . Depression   . Complication of anesthesia nausea and vomiting   . [place] esophagus   . C5 vertebral fracture ([place]) 06/01/2017  . Cervical spinal stenosis 05/17/2017  . DVT (deep vein thrombosis) in pregnancy 10/17/1971    Past Surgical History:  Procedure Laterality Date  . ABDOMINAL ADHESION SURGERY  1992  . ABDOMINAL HYSTERECTOMY  1990  . ANTERIOR CERVICAL DECOMP/DISCECTOMY FUSION  05/17/2017  . ANTERIOR CERVICAL DECOMP/DISCECTOMY FUSION N/A 05/17/2017   Procedure: Anterior Cervical Decompression/Discectomy Fusion - Cervical four-Cervical five - Cervical five-Cervical six;  Surgeon: Earnie Larsson, MD;  Location: [facility];  Service: Neurosurgery;  Laterality: N/A;  . ANTERIOR CERVICAL DECOMP/DISCECTOMY FUSION N/A 06/01/2017   Procedure: Revision on Anterior Cervical Decompression/Discectomy  Cervical four-six;  Surgeon: Earnie Larsson, MD;  Location: [facility];  Service: Neurosurgery;  Laterality: N/A;  . APPENDECTOMY    . BACK SURGERY    . BREAST SURGERY Right 1968   2 tumors on breast -benign  . BREAST SURGERY Right 1980s   1 tumor on breast -benign  . CHOLECYSTECTOMY OPEN    . CYST EXCISION Left 2001   index finger  . CYSTOSCOPY W/ STONE MANIPULATION  2006-2007 X 3  . DILATION AND CURETTAGE OF UTERUS  1984  . ENTEROCELE REPAIR  2007  . ESOPHAGOGASTRODUODENOSCOPY (EGD) WITH ESOPHAGEAL DILATION  4-5 Xs  . EYE SURGERY    . INCONTINENCE SURGERY  1991; 1993  . JOINT REPLACEMENT    . JOINT REPLACEMENT Left 2017   thumb; "replaced joint w/tendon"  . KNEE ARTHROSCOPY Right   . LAMINECTOMY  09/04/2011   Procedure: LUMBAR LAMINECTOMY FOR TUMOR;  Surgeon: Cooper Render Pool;  Location: [facility];  Service: Neurosurgery;  Laterality: N/A;  Lumbar five laminectomy for resection of intradural tumor  . REFRACTIVE SURGERY  Bilateral   . TENDON REPAIR Left 2017   ruptured tendon on thumb 2 wk after joint replaced w/tendon  . TOTAL KNEE ARTHROPLASTY Left 08/26/2018  . TOTAL KNEE ARTHROPLASTY Left 08/26/2018   Procedure: TOTAL KNEE ARTHROPLASTY;  Surgeon: Elsie Saas, MD;  Location: [facility];  Service: Orthopedics;  Laterality: Left;     OB History   No obstetric history on file.     No family history on file.  Social History   Tobacco Use  . Smoking status: Never Smoker  . Smokeless tobacco: Never Used  Substance Use Topics  . Alcohol use: No  . Drug use: No    Home Medications Prior to Admission medications   Medication Sig Start Date End Date Taking? Authorizing Provider  Abatacept (ORENCIA) 125 MG/ML SOSY Inject 125 mg into the skin every 7 (seven) days. Saturdays    [provider]  acetaminophen (TYLENOL 8 HOUR) 650 MG CR tablet Take 1 tablet (650 mg total) by mouth every 8 (eight) hours as needed for pain or fever. 03/02/20   Varney Biles, MD  amitriptyline (ELAVIL) 50 MG tablet Take 50 mg by mouth at bedtime.      [provider]  apixaban (ELIQUIS) 2.5 MG TABS tablet 1 tablet bid to prevent blood clots 08/28/18   Shepperson, Kirstin, PA-C  aspirin-acetaminophen-caffeine (EXCEDRIN MIGRAINE) [phone] MG tablet Take 2 tablets by mouth daily as needed for headache.     [provider]  cephALEXin (KEFLEX) 500 MG capsule Take 1 capsule (500 mg total) by mouth 4 (four) times daily. 03/02/20   Varney Biles, MD  Cholecalciferol (HM VITAMIN D3) 4000 UNITS CAPS Take 4,000 Units by mouth daily.      [provider]  docusate sodium (COLACE) 100 MG capsule 1 tab 2 times a day while on narcotics.  STOOL SOFTENER 08/28/18   Shepperson, Kirstin, PA-C  Dulaglutide (TRULICITY) 1.5 0000000 SOPN Inject 1.5 mg into the skin every 7 (seven) days. Sunday    [provider]  folic acid (FOLVITE) Q000111Q MCG tablet Take 1,600 mcg by mouth daily.    [provider]  HYDROcodone-acetaminophen (NORCO/VICODIN) 5-325 MG tablet Take 1 tablet by mouth every 8 (eight) hours as needed for up to 3 days for severe pain. 03/02/20 03/05/20  Varney Biles, MD  imiquimod (ALDARA) 5 % cream Apply 1 application topically 3 (three) times a week. As needed    [provider]  Insulin Detemir (LEVEMIR FLEXPEN) 100 UNIT/ML Pen Inject 30 Units into the skin every morning.     [provider]  levothyroxine (SYNTHROID, LEVOTHROID) 112 MCG tablet Take 112 mcg by mouth at bedtime.     [provider]  nadolol (CORGARD) 80 MG tablet Take 80 mg by mouth every evening.     [provider]  omeprazole (PRILOSEC) 40 MG capsule Take 40 mg by  mouth 2 (two) times daily.    [provider]  Polyethyl Glyc-Propyl Glyc PF (SYSTANE ULTRA PF) 0.4-0.3 % SOLN Place 1 drop into both eyes 4 (four) times daily as needed.    [provider]  polyethylene glycol (MIRALAX / GLYCOLAX) packet 17grams in [place] of something to drink twice a day until bowel movement.  LAXITIVE.  Restart if two days since last bowel movement 08/28/18   Shepperson, Kirstin, PA-C  pravastatin (PRAVACHOL) 40 MG tablet Take 60 mg by mouth at bedtime. Takes 1.5 tablet 03/14/16   [provider]    Allergies    Etanercept, Iodinated diagnostic agents, Sulfamethoxazole-trimethoprim, Lisinopril, Nitrofurantoin, Canagliflozin, Fluoxetine, Ioxaglate, Nabumetone, Tramadol, Adalimumab, Metformin, Olmesartan, Penicillins, and Red dye  Review of Systems   Review of Systems  Constitutional: Positive for activity change.  Respiratory: Negative for shortness of breath.   Cardiovascular: Negative for chest pain.  Musculoskeletal: Positive for arthralgias.  Neurological: Positive for headaches. Negative for dizziness.  Hematological: Does not bruise/bleed easily.  All other systems reviewed and are negative.   Physical Exam Updated Vital Signs BP (!) 158/81    Pulse 68   Temp 98.3 F (36.8 C) (Oral)   Resp 16   Ht 5\' 7"  (1.702 m)   Wt 74.8 kg   SpO2 98%   BMI 25.84 kg/m   Physical Exam Vitals and nursing note reviewed.  Constitutional:      Appearance: She is well-developed.  HENT:     Head: Normocephalic and atraumatic.  Eyes:     Extraocular Movements: Extraocular movements intact.     Pupils: Pupils are equal, round, and reactive to light.  Neck:     Comments: No midline c-spine tenderness, pt able to turn head to 45 degrees bilaterally without any pain and able to flex neck to the chest and extend without any pain or neurologic symptoms. Cardiovascular:     Rate and Rhythm: Normal rate.  Pulmonary:     Effort: Pulmonary effort is normal.  Abdominal:     General: Bowel sounds are normal.  Musculoskeletal:     Cervical back: Normal range of motion and neck supple.  Skin:    General: Skin is warm and dry.  Neurological:     Mental Status: She is alert and oriented to person, place, and time.     ED Results / Procedures / Treatments   Labs (all labs ordered are listed, but only abnormal results are displayed) Labs Reviewed - No data to display  EKG None  Radiology CT Head Wo Contrast  Result Date: 03/02/2020 CLINICAL DATA:  Patient tripped and fell. Right facial laceration and swelling. History of diabetes. EXAM: CT HEAD WITHOUT CONTRAST CT MAXILLOFACIAL WITHOUT CONTRAST TECHNIQUE: Multidetector CT imaging of the head and maxillofacial structures were performed using the standard protocol without intravenous contrast. Multiplanar CT image reconstructions of the maxillofacial structures were also generated. COMPARISON:  Limited correlation made with cervical spine CT 05/31/2017 FINDINGS: CT HEAD FINDINGS Brain: There is no evidence of acute intracranial hemorrhage, mass lesion, brain edema or extra-axial fluid collection. Mild atrophy with mild prominence of the ventricles and subarachnoid spaces. There is patchy low-density  in the periventricular white matter, most likely due to chronic small vessel ischemic changes. There is no CT evidence of acute cortical infarction. Vascular: Mild intracranial atherosclerosis. No hyperdense vessel identified. Skull: Negative for fracture or focal lesion. Sinuses/Orbits: Described below. Other: Right periorbital and supraorbital soft tissue swelling. The mastoid air cells and middle ears are  clear. CT MAXILLOFACIAL FINDINGS Osseous: There are multiple acute fractures involving the walls of the right maxillary sinus. Fracture of the floor of the right orbit demonstrates up to 7 mm of inferior displacement on coronal image 35/8. No resulting extraocular muscle entrapment. There are also mildly displaced fractures involving the anterior, posterolateral and medial walls of the right maxillary sinus. The additional walls of the right orbit, maxilla and pterygoid plates are intact. The zygomatic arches and mandible are intact. No left-sided facial fractures. Previous cervical fusion. Orbits: The globes are intact. There is no lens displacement. The optic nerves and extraocular muscles appear intact. There is asymmetric preseptal soft tissue swelling on the right without significant post septal inflammation or hematoma. A small amount of air is present inferiorly in the right orbit related to the orbital floor fracture. Sinuses: The right maxillary sinus is opacified with blood. Minimal mucosal thickening inferiorly in the ethmoid sinuses and left maxillary sinus. The mastoid air cells and middle ears are clear. Soft tissues: Right periorbital soft tissue swelling extending into the right frontal scalp and right malar regions. No foreign bodies. IMPRESSION: 1. Multiple acute fractures involving the walls of the right maxillary sinus with displacement of the floor of the right orbit. No resulting extraocular muscle entrapment. 2. No acute intracranial or calvarial findings. Mild chronic small vessel  ischemic changes. 3. Right periorbital and supraorbital soft tissue swelling. Electronically Signed   By: Richardean Sale M.D.   On: 03/02/2020 13:03   DG Hand Complete Right  Result Date: 03/02/2020 CLINICAL DATA:  [place] on outstretched hand today, pain at RIGHT palm EXAM: RIGHT HAND - COMPLETE 3+ VIEW COMPARISON:  None FINDINGS: Osseous demineralization. Scattered degenerative changes throughout IP joints especially DIP joints RIGHT hand greatest at middle finger DIP joint. Additional degenerative changes at STT joint. Small non fused ossicle at tip of ulnar styloid versus sequela of remote injury. Subtle contour abnormality identified at volar aspect distal radial metaphysis cannot exclude nondisplaced fracture. No additional fracture dislocation. IMPRESSION: Questionable nondisplaced distal radial metaphyseal fracture; correlation for pain/tenderness at this site recommended, and may consider dedicated wrist radiographs if patient is tender at this site. Osseous demineralization with scattered degenerative changes in RIGHT hand as above. Electronically Signed   By: Lavonia Dana M.D.   On: 03/02/2020 13:01   CT Maxillofacial WO CM  Result Date: 03/02/2020 CLINICAL DATA:  Patient tripped and fell. Right facial laceration and swelling. History of diabetes. EXAM: CT HEAD WITHOUT CONTRAST CT MAXILLOFACIAL WITHOUT CONTRAST TECHNIQUE: Multidetector CT imaging of the head and maxillofacial structures were performed using the standard protocol without intravenous contrast. Multiplanar CT image reconstructions of the maxillofacial structures were also generated. COMPARISON:  Limited correlation made with cervical spine CT 05/31/2017 FINDINGS: CT HEAD FINDINGS Brain: There is no evidence of acute intracranial hemorrhage, mass lesion, brain edema or extra-axial fluid collection. Mild atrophy with mild prominence of the ventricles and subarachnoid spaces. There is patchy low-density in the periventricular white matter,  most likely due to chronic small vessel ischemic changes. There is no CT evidence of acute cortical infarction. Vascular: Mild intracranial atherosclerosis. No hyperdense vessel identified. Skull: Negative for fracture or focal lesion. Sinuses/Orbits: Described below. Other: Right periorbital and supraorbital soft tissue swelling. The mastoid air cells and middle ears are clear. CT MAXILLOFACIAL FINDINGS Osseous: There are multiple acute fractures involving the walls of the right maxillary sinus. Fracture of the floor of the right orbit demonstrates up to 7 mm of inferior displacement on coronal  image 35/8. No resulting extraocular muscle entrapment. There are also mildly displaced fractures involving the anterior, posterolateral and medial walls of the right maxillary sinus. The additional walls of the right orbit, maxilla and pterygoid plates are intact. The zygomatic arches and mandible are intact. No left-sided facial fractures. Previous cervical fusion. Orbits: The globes are intact. There is no lens displacement. The optic nerves and extraocular muscles appear intact. There is asymmetric preseptal soft tissue swelling on the right without significant post septal inflammation or hematoma. A small amount of air is present inferiorly in the right orbit related to the orbital floor fracture. Sinuses: The right maxillary sinus is opacified with blood. Minimal mucosal thickening inferiorly in the ethmoid sinuses and left maxillary sinus. The mastoid air cells and middle ears are clear. Soft tissues: Right periorbital soft tissue swelling extending into the right frontal scalp and right malar regions. No foreign bodies. IMPRESSION: 1. Multiple acute fractures involving the walls of the right maxillary sinus with displacement of the floor of the right orbit. No resulting extraocular muscle entrapment. 2. No acute intracranial or calvarial findings. Mild chronic small vessel ischemic changes. 3. Right periorbital and  supraorbital soft tissue swelling. Electronically Signed   By: Richardean Sale M.D.   On: 03/02/2020 13:03    Procedures Procedures (including critical care time)  Medications Ordered in ED Medications  ondansetron (ZOFRAN-ODT) disintegrating tablet 4 mg (4 mg Oral Given 03/02/20 1157)  acetaminophen (TYLENOL) tablet 650 mg (650 mg Oral Given 03/02/20 1256)  lidocaine-EPINEPHrine (XYLOCAINE W/EPI) 2 %-1:200000 (PF) injection 10 mL (10 mLs Infiltration Given by Other 03/02/20 1257)  Tdap (BOOSTRIX) injection 0.5 mL (0.5 mLs Intramuscular Given 03/02/20 1257)  ketorolac (TORADOL) 30 MG/ML injection 30 mg (30 mg Intramuscular Given 03/02/20 1314)    ED Course  I have reviewed the triage vital signs and the nursing notes.  Pertinent labs & imaging results that were available during my care of the patient were reviewed by me and considered in my medical decision making (see chart for details).    MDM Rules/Calculators/A&P                      74 year old comes in a chief complaint of fall.  Patient's C-spine has been cleared clinically.  On our evaluation it seems like she will likely have facial fractures, especially given that she is having some mild bleeding from her nares. There is no clots passage or large bleeding. She will not need packing. CT face and CT head ordered. X-ray of the hand also ordered.  Reassessment: Hand x-ray reveals that patient has possible fracture. On exam she does have tenderness over the distal radial region. We will put her in a sugar tong splint and have her follow-up with hand surgery.  Additionally she is also noted to have multiple facial fractures and orbital floor fracture. Results of the ED work-up discussed with her and she has been advised to follow-up with ENT.  Final Clinical Impression(s) / ED Diagnoses Final diagnoses:  Closed fracture of facial bone due to fall, initial encounter ([place])  Elevated blood pressure reading  Closed fracture of  distal end of right radius, unspecified fracture morphology, initial encounter    Rx / DC Orders ED Discharge Orders         Ordered    HYDROcodone-acetaminophen (NORCO/VICODIN) 5-325 MG tablet  Every 8 hours PRN     03/02/20 1511    acetaminophen (TYLENOL 8 HOUR) 650 MG CR tablet  Every 8 hours PRN     03/02/20 1511    cephALEXin (KEFLEX) 500 MG capsule  4 times daily     03/02/20 1513           Varney Biles, MD 03/02/20 1558

## 2020-04-19 ENCOUNTER — Emergency Department (HOSPITAL_BASED_OUTPATIENT_CLINIC_OR_DEPARTMENT_OTHER)
Admission: EM | Admit: 2020-04-19 | Discharge: 2020-04-19 | Disposition: A | Discharge status: Home / Self Care | Payer: Medicare HMO | Attending: Emergency Medicine | Admitting: Emergency Medicine

## 2020-04-19 ENCOUNTER — Other Ambulatory Visit: Payer: Self-pay

## 2020-04-19 ENCOUNTER — Encounter (HOSPITAL_BASED_OUTPATIENT_CLINIC_OR_DEPARTMENT_OTHER): Payer: Self-pay

## 2020-04-19 DIAGNOSIS — E119 Type 2 diabetes mellitus without complications: Secondary | ICD-10-CM | POA: Insufficient documentation

## 2020-04-19 DIAGNOSIS — R220 Localized swelling, mass and lump, head: Secondary | ICD-10-CM | POA: Diagnosis present

## 2020-04-19 DIAGNOSIS — Z96652 Presence of left artificial knee joint: Secondary | ICD-10-CM | POA: Insufficient documentation

## 2020-04-19 DIAGNOSIS — E039 Hypothyroidism, unspecified: Secondary | ICD-10-CM | POA: Insufficient documentation

## 2020-04-19 DIAGNOSIS — I10 Essential (primary) hypertension: Secondary | ICD-10-CM | POA: Diagnosis not present

## 2020-04-19 DIAGNOSIS — R590 Localized enlarged lymph nodes: Secondary | ICD-10-CM

## 2020-04-19 NOTE — Discharge Instructions (Addendum)
You were evaluated in the Emergency Department and after careful evaluation, we did not find any emergent condition requiring admission or further testing in the hospital.  Your exam/testing today was overall reassuring.  Your symptoms are likely explained by a swollen lymph node.  We are reassured that the swelling is improving with time.  If this continues to be an issue we recommend follow-up with your primary care doctor.  Use Tylenol and Motrin at home for discomfort.  Please return to the Emergency Department if you experience any worsening of your condition.  We encourage you to follow up with a primary care provider.  Thank you for allowing [place] to be a part of your care.

## 2020-04-19 NOTE — ED Provider Notes (Signed)
[facility] Emergency Department Provider Note MRN:  [id number]  Arrival date & time: 04/19/20     Chief Complaint   Cyst   History of Present Illness   Sara Diaz is a 74 y.o. year-old female with a history of diabetes, RA presenting to the ED with chief complaint of cyst.  At about noon today patient noticed a nodule in front of the right ear next to the right jaw.  Tender to palpation.  Symptoms mild to moderate in severity, at one point the nodule was as large as a grape.  Came to the emergency department for evaluation.  During her time in the waiting room, the swelling seemed to go away.  Still mildly tender.  Denies any recent fever or cough or cold-like symptoms, no night sweats or unintentional weight loss, no chest pain or shortness of breath, no other complaints.  Review of Systems  A complete 10 system review of systems was obtained and all systems are negative except as noted in the HPI and PMH.   Patient's Health History    Past Medical History:  Diagnosis Date  . Arthritis    "neck ,hands, back" (05/17/2017)  . Barrett esophagus   . Complication of anesthesia nausea and vomiting   . Depression   . DVT (deep vein thrombosis) in pregnancy 1973   BLE; "I had a 10# pregnancy"  . Dyspnea    with exertion  . GERD (gastroesophageal reflux disease)    off prilosec .developed cough .thought to be allergic  to prilosec  . Headache(784.0)    "stopped having them daily after back OR in 2012; restarted in 2017; had 1-2/month" (05/17/2017)  . High cholesterol   . History of kidney stones 2006,2007  . Hypertension   . Hypothyroidism   . Neuromuscular disorder (HCC)    weakness r leg.  . Osteoporosis   . Pneumonia 2015-2016 X 2  . PONV (postoperative nausea and vomiting)   . Pulmonary embolism ([place]) 1973 X 2  . RA (rheumatoid arthritis) ([place])   . Type II diabetes mellitus ([place])    Type II    Past Surgical History:  Procedure Laterality  Date  . ABDOMINAL ADHESION SURGERY  1992  . ABDOMINAL HYSTERECTOMY  1990  . ANTERIOR CERVICAL DECOMP/DISCECTOMY FUSION  05/17/2017  . ANTERIOR CERVICAL DECOMP/DISCECTOMY FUSION N/A 05/17/2017   Procedure: Anterior Cervical Decompression/Discectomy Fusion - Cervical four-Cervical five - Cervical five-Cervical six;  Surgeon: Earnie Larsson, MD;  Location: [facility];  Service: Neurosurgery;  Laterality: N/A;  . ANTERIOR CERVICAL DECOMP/DISCECTOMY FUSION N/A 06/01/2017   Procedure: Revision on Anterior Cervical Decompression/Discectomy  Cervical four-six;  Surgeon: Earnie Larsson, MD;  Location: [facility];  Service: Neurosurgery;  Laterality: N/A;  . APPENDECTOMY    . BACK SURGERY    . BREAST SURGERY Right 1968   2 tumors on breast -benign  . BREAST SURGERY Right 1980s   1 tumor on breast -benign  . CHOLECYSTECTOMY OPEN    . CYST EXCISION Left 2001   index finger  . CYSTOSCOPY W/ STONE MANIPULATION  2006-2007 X 3  . DILATION AND CURETTAGE OF UTERUS  1984  . ENTEROCELE REPAIR  2007  . ESOPHAGOGASTRODUODENOSCOPY (EGD) WITH ESOPHAGEAL DILATION  4-5 Xs  . EYE SURGERY    . INCONTINENCE SURGERY  1991; 1993  . JOINT REPLACEMENT    . JOINT REPLACEMENT Left 2017   thumb; "replaced joint w/tendon"  . KNEE ARTHROSCOPY Right   . LAMINECTOMY  09/04/2011  Procedure: LUMBAR LAMINECTOMY FOR TUMOR;  Surgeon: Cooper Render Pool;  Location: [facility];  Service: Neurosurgery;  Laterality: N/A;  Lumbar five laminectomy for resection of intradural tumor  . REFRACTIVE SURGERY Bilateral   . TENDON REPAIR Left 2017   ruptured tendon on thumb 2 wk after joint replaced w/tendon  . TOTAL KNEE ARTHROPLASTY Left 08/26/2018  . TOTAL KNEE ARTHROPLASTY Left 08/26/2018   Procedure: TOTAL KNEE ARTHROPLASTY;  Surgeon: Elsie Saas, MD;  Location: [facility];  Service: Orthopedics;  Laterality: Left;    No family history on file.  Social History   Socioeconomic History  . Marital status: Married    Spouse name: Not on file  . Number of  children: Not on file  . Years of education: Not on file  . Highest education level: Not on file  Occupational History  . Not on file  Tobacco Use  . Smoking status: Never Smoker  . Smokeless tobacco: Never Used  Vaping Use  . Vaping Use: Never used  Substance and Sexual Activity  . Alcohol use: No  . Drug use: No  . Sexual activity: Not Currently    Birth control/protection: Post-menopausal  Other Topics Concern  . Not on file  Social History Narrative  . Not on file   Social Determinants of Health   Financial Resource Strain:   . Difficulty of Paying Living Expenses:   Food Insecurity:   . Worried About Charity fundraiser in the Last Year:   . Arboriculturist in the Last Year:   Transportation Needs:   . Film/video editor (Medical):   Marland Kitchen Lack of Transportation (Non-Medical):   Physical Activity:   . Days of Exercise per Week:   . Minutes of Exercise per Session:   Stress:   . Feeling of Stress :   Social Connections:   . Frequency of Communication with Friends and Family:   . Frequency of Social Gatherings with Friends and Family:   . Attends Religious Services:   . Active Member of Clubs or Organizations:   . Attends Archivist Meetings:   Marland Kitchen Marital Status:   Intimate Partner Violence:   . Fear of Current or Ex-Partner:   . Emotionally Abused:   Marland Kitchen Physically Abused:   . Sexually Abused:      Physical Exam   Vitals:   04/19/20 1937 04/19/20 2229  BP: (!) 155/86 (!) 176/96  Pulse: 75 66  Resp: 16 18  Temp: 98.8 F (37.1 C) 98.2 F (36.8 C)  SpO2: 96% 100%    CONSTITUTIONAL: Well-appearing, NAD NEURO:  Alert and oriented x 3, no focal deficits EYES:  eyes equal and reactive ENT/NECK: Mild tenderness palpation to the right preauricular area, no appreciable lymphadenopathy CARDIO: Regular rate, well-perfused, normal S1 and S2 PULM:  CTAB no wheezing or rhonchi GI/GU:  normal bowel sounds, non-distended, non-tender MSK/SPINE:  No gross  deformities, no edema SKIN:  no rash, atraumatic PSYCH:  Appropriate speech and behavior  *Additional and/or pertinent findings included in MDM below  Diagnostic and Interventional Summary    EKG Interpretation  Date/Time:    Ventricular Rate:    PR Interval:    QRS Duration:   QT Interval:    QTC Calculation:   R Axis:     Text Interpretation:        Labs Reviewed - No data to display  No orders to display    Medications - No data to display   Procedures  /  Critical Care Procedures  ED Course and Medical Decision Making  I have reviewed the triage vital signs, the nursing notes, and pertinent available records from the EMR.  Listed above are laboratory and imaging tests that I personally ordered, reviewed, and interpreted and then considered in my medical decision making (see below for details).      Suspect preauricular lymphadenopathy versus symptoms related to TMJ, no appreciable lymphadenopathy on my exam, normal vital signs, largely without symptoms, appropriate for discharge with PCP follow-up.    Barth Kirks. Sedonia Small, [place] [email or web address]  Final Clinical Impressions(s) / ED Diagnoses     ICD-10-CM   1. Preauricular lymphadenopathy  R59.0     ED Discharge Orders    None       Discharge Instructions Discussed with and Provided to Patient:     Discharge Instructions     You were evaluated in the Emergency Department and after careful evaluation, we did not find any emergent condition requiring admission or further testing in the hospital.  Your exam/testing today was overall reassuring.  Your symptoms are likely explained by a swollen lymph node.  We are reassured that the swelling is improving with time.  If this continues to be an issue we recommend follow-up with your primary care doctor.  Use Tylenol and Motrin at home for discomfort.  Please return to the Emergency Department if you  experience any worsening of your condition.  We encourage you to follow up with a primary care provider.  Thank you for allowing [place] to be a part of your care.       Maudie Flakes, MD 04/19/20 [phone]

## 2020-04-19 NOTE — ED Triage Notes (Signed)
Pt c/o "a knot and swelling" to right jaw area in front of ear-NAD-steady gait

## 2020-07-09 ENCOUNTER — Other Ambulatory Visit (HOSPITAL_COMMUNITY): Payer: Self-pay | Admitting: *Deleted

## 2020-07-12 ENCOUNTER — Ambulatory Visit (HOSPITAL_COMMUNITY): Payer: Medicare HMO

## 2020-07-16 ENCOUNTER — Inpatient Hospital Stay (HOSPITAL_COMMUNITY): Admission: RE | Admit: 2020-07-16 | Payer: Medicare HMO | Source: Ambulatory Visit

## 2022-11-08 DIAGNOSIS — M0579 Rheumatoid arthritis with rheumatoid factor of multiple sites without organ or systems involvement: Secondary | ICD-10-CM | POA: Diagnosis not present

## 2022-11-08 DIAGNOSIS — Z79899 Other long term (current) drug therapy: Secondary | ICD-10-CM | POA: Diagnosis not present

## 2022-11-13 DIAGNOSIS — E1165 Type 2 diabetes mellitus with hyperglycemia: Secondary | ICD-10-CM | POA: Diagnosis not present

## 2022-11-20 DIAGNOSIS — E1122 Type 2 diabetes mellitus with diabetic chronic kidney disease: Secondary | ICD-10-CM | POA: Diagnosis not present

## 2022-11-20 DIAGNOSIS — M810 Age-related osteoporosis without current pathological fracture: Secondary | ICD-10-CM | POA: Diagnosis not present

## 2022-11-20 DIAGNOSIS — N182 Chronic kidney disease, stage 2 (mild): Secondary | ICD-10-CM | POA: Diagnosis not present

## 2022-11-20 DIAGNOSIS — I129 Hypertensive chronic kidney disease with stage 1 through stage 4 chronic kidney disease, or unspecified chronic kidney disease: Secondary | ICD-10-CM | POA: Diagnosis not present

## 2022-11-20 DIAGNOSIS — E559 Vitamin D deficiency, unspecified: Secondary | ICD-10-CM | POA: Diagnosis not present

## 2022-11-22 DIAGNOSIS — Z794 Long term (current) use of insulin: Secondary | ICD-10-CM | POA: Diagnosis not present

## 2022-11-22 DIAGNOSIS — E039 Hypothyroidism, unspecified: Secondary | ICD-10-CM | POA: Diagnosis not present

## 2022-11-22 DIAGNOSIS — N182 Chronic kidney disease, stage 2 (mild): Secondary | ICD-10-CM | POA: Diagnosis not present

## 2022-11-22 DIAGNOSIS — I1A0 Resistant hypertension: Secondary | ICD-10-CM | POA: Diagnosis not present

## 2022-11-22 DIAGNOSIS — R6889 Other general symptoms and signs: Secondary | ICD-10-CM | POA: Diagnosis not present

## 2022-11-22 DIAGNOSIS — I129 Hypertensive chronic kidney disease with stage 1 through stage 4 chronic kidney disease, or unspecified chronic kidney disease: Secondary | ICD-10-CM | POA: Diagnosis not present

## 2022-11-22 DIAGNOSIS — E1165 Type 2 diabetes mellitus with hyperglycemia: Secondary | ICD-10-CM | POA: Diagnosis not present

## 2022-11-22 DIAGNOSIS — R7989 Other specified abnormal findings of blood chemistry: Secondary | ICD-10-CM | POA: Diagnosis not present

## 2022-11-22 DIAGNOSIS — E1122 Type 2 diabetes mellitus with diabetic chronic kidney disease: Secondary | ICD-10-CM | POA: Diagnosis not present

## 2022-11-22 DIAGNOSIS — M810 Age-related osteoporosis without current pathological fracture: Secondary | ICD-10-CM | POA: Diagnosis not present

## 2022-11-24 DIAGNOSIS — H903 Sensorineural hearing loss, bilateral: Secondary | ICD-10-CM | POA: Diagnosis not present

## 2022-12-04 ENCOUNTER — Telehealth: Payer: Self-pay

## 2022-12-04 NOTE — Telephone Encounter (Signed)
Patient called in to check referral status stated she had it sent over this morning, Let her know that as of 4:0pm we had not received it but we will give her a call if we do and she also check back with referring provider to ensure that she sent it. Also made patient aware that dr Renne Crigler is not accepting new patient that our only provider is booked until December, Patient then stated " well thanks for nothing. People will just have to be sick and die" patient ended call

## 2022-12-07 DIAGNOSIS — I129 Hypertensive chronic kidney disease with stage 1 through stage 4 chronic kidney disease, or unspecified chronic kidney disease: Secondary | ICD-10-CM | POA: Diagnosis not present

## 2022-12-07 DIAGNOSIS — E039 Hypothyroidism, unspecified: Secondary | ICD-10-CM | POA: Diagnosis not present

## 2022-12-07 DIAGNOSIS — R112 Nausea with vomiting, unspecified: Secondary | ICD-10-CM | POA: Diagnosis not present

## 2022-12-07 DIAGNOSIS — R6889 Other general symptoms and signs: Secondary | ICD-10-CM | POA: Diagnosis not present

## 2022-12-07 DIAGNOSIS — E1122 Type 2 diabetes mellitus with diabetic chronic kidney disease: Secondary | ICD-10-CM | POA: Diagnosis not present

## 2022-12-07 DIAGNOSIS — R5382 Chronic fatigue, unspecified: Secondary | ICD-10-CM | POA: Diagnosis not present

## 2022-12-07 DIAGNOSIS — E1165 Type 2 diabetes mellitus with hyperglycemia: Secondary | ICD-10-CM | POA: Diagnosis not present

## 2022-12-07 DIAGNOSIS — E538 Deficiency of other specified B group vitamins: Secondary | ICD-10-CM | POA: Diagnosis not present

## 2022-12-07 DIAGNOSIS — N182 Chronic kidney disease, stage 2 (mild): Secondary | ICD-10-CM | POA: Diagnosis not present

## 2022-12-07 DIAGNOSIS — R7989 Other specified abnormal findings of blood chemistry: Secondary | ICD-10-CM | POA: Diagnosis not present

## 2022-12-07 DIAGNOSIS — M6281 Muscle weakness (generalized): Secondary | ICD-10-CM | POA: Diagnosis not present

## 2022-12-07 DIAGNOSIS — Z794 Long term (current) use of insulin: Secondary | ICD-10-CM | POA: Diagnosis not present

## 2022-12-14 DIAGNOSIS — E1165 Type 2 diabetes mellitus with hyperglycemia: Secondary | ICD-10-CM | POA: Diagnosis not present

## 2023-01-01 DIAGNOSIS — D2361 Other benign neoplasm of skin of right upper limb, including shoulder: Secondary | ICD-10-CM | POA: Diagnosis not present

## 2023-10-14 ENCOUNTER — Emergency Department (HOSPITAL_BASED_OUTPATIENT_CLINIC_OR_DEPARTMENT_OTHER)
Admission: EM | Admit: 2023-10-14 | Discharge: 2023-10-14 | Disposition: A | Discharge status: Home / Self Care | Payer: Medicare HMO

## 2023-10-14 ENCOUNTER — Other Ambulatory Visit: Payer: Self-pay

## 2023-10-14 ENCOUNTER — Encounter (HOSPITAL_BASED_OUTPATIENT_CLINIC_OR_DEPARTMENT_OTHER): Payer: Self-pay | Admitting: Emergency Medicine

## 2023-10-14 DIAGNOSIS — R112 Nausea with vomiting, unspecified: Secondary | ICD-10-CM | POA: Insufficient documentation

## 2023-10-14 DIAGNOSIS — Z7901 Long term (current) use of anticoagulants: Secondary | ICD-10-CM | POA: Insufficient documentation

## 2023-10-14 DIAGNOSIS — Z20822 Contact with and (suspected) exposure to covid-19: Secondary | ICD-10-CM | POA: Insufficient documentation

## 2023-10-14 DIAGNOSIS — E860 Dehydration: Secondary | ICD-10-CM | POA: Insufficient documentation

## 2023-10-14 DIAGNOSIS — Z79899 Other long term (current) drug therapy: Secondary | ICD-10-CM | POA: Insufficient documentation

## 2023-10-14 DIAGNOSIS — R944 Abnormal results of kidney function studies: Secondary | ICD-10-CM | POA: Diagnosis not present

## 2023-10-14 DIAGNOSIS — K529 Noninfective gastroenteritis and colitis, unspecified: Secondary | ICD-10-CM | POA: Insufficient documentation

## 2023-10-14 DIAGNOSIS — E871 Hypo-osmolality and hyponatremia: Secondary | ICD-10-CM | POA: Insufficient documentation

## 2023-10-14 DIAGNOSIS — Z7982 Long term (current) use of aspirin: Secondary | ICD-10-CM | POA: Insufficient documentation

## 2023-10-14 LAB — COMPREHENSIVE METABOLIC PANEL
ALT: 12 U/L (ref 0–44)
AST: 18 U/L (ref 15–41)
Albumin: 3.1 g/dL — ABNORMAL LOW (ref 3.5–5.0)
Alkaline Phosphatase: 70 U/L (ref 38–126)
Anion gap: 8 (ref 5–15)
BUN: 20 mg/dL (ref 8–23)
CO2: 19 mmol/L — ABNORMAL LOW (ref 22–32)
Calcium: 9.2 mg/dL (ref 8.9–10.3)
Chloride: 106 mmol/L (ref 98–111)
Creatinine, Ser: 1.19 mg/dL — ABNORMAL HIGH (ref 0.44–1.00)
GFR, Estimated: 47 mL/min — ABNORMAL LOW (ref >60)
Glucose, Bld: 181 mg/dL — ABNORMAL HIGH (ref 70–99)
Potassium: 3.8 mmol/L (ref 3.5–5.1)
Sodium: 133 mmol/L — ABNORMAL LOW (ref 135–145)
Total Bilirubin: 1 mg/dL (ref <1.2)
Total Protein: 5.9 g/dL — ABNORMAL LOW (ref 6.5–8.1)

## 2023-10-14 LAB — RESP PANEL BY RT-PCR (RSV, FLU A&B, COVID)  RVPGX2
Influenza A by PCR: NEGATIVE
Influenza B by PCR: NEGATIVE
Resp Syncytial Virus by PCR: NEGATIVE
SARS Coronavirus 2 by RT PCR: NEGATIVE

## 2023-10-14 LAB — CBC
HCT: 40.5 % (ref 36.0–46.0)
Hemoglobin: 13.3 g/dL (ref 12.0–15.0)
MCH: 30 pg (ref 26.0–34.0)
MCHC: 32.8 g/dL (ref 30.0–36.0)
MCV: 91.2 fL (ref 80.0–100.0)
Platelets: 177 10*3/uL (ref 150–400)
RBC: 4.44 MIL/uL (ref 3.87–5.11)
RDW: 14.7 % (ref 11.5–15.5)
WBC: 8 10*3/uL (ref 4.0–10.5)
nRBC: 0 % (ref 0.0–0.2)

## 2023-10-14 LAB — LIPASE, BLOOD: Lipase: 23 U/L (ref 11–51)

## 2023-10-14 MED ORDER — ONDANSETRON HCL 4 MG PO TABS: 4.0000 mg | ORAL_TABLET | Freq: Three times a day (TID) | ORAL | 0 refills | Status: AC | PRN

## 2023-10-14 MED ORDER — LOPERAMIDE HCL 2 MG PO CAPS
4.0000 mg | ORAL_CAPSULE | Freq: Once | ORAL | Status: AC
  Administered 2023-10-14: 4 mg via ORAL
  Filled 2023-10-14: qty 2

## 2023-10-14 MED ORDER — ONDANSETRON 4 MG PO TBDP
4.0000 mg | ORAL_TABLET | Freq: Once | ORAL | Status: AC | PRN
  Administered 2023-10-14: 4 mg via ORAL
  Filled 2023-10-14: qty 1

## 2023-10-14 MED ORDER — SODIUM CHLORIDE 0.9 % IV BOLUS
1000.0000 mL | Freq: Once | INTRAVENOUS | Status: AC
  Administered 2023-10-14: 1000 mL via INTRAVENOUS

## 2023-10-14 NOTE — ED Notes (Signed)
Bladder scan completed per order. Scanned several times and scanner showing 0ml every time.

## 2023-10-14 NOTE — ED Provider Notes (Signed)
[facility] Provider Note   CSN: [id number] Arrival date & time: 10/14/23  0515     History  Chief Complaint  Patient presents with   Emesis   Diarrhea    Sara Diaz is a 77 y.o. female.  This is a 77 year old female presenting emergency department for nausea vomiting diarrhea x 4 days.  Reports no fevers or chills.  Had some crampy abdominal pain earlier, none currently.  Took Imodium last night had some initial relief, but then began having diarrhea again early this morning.  States she feels generally weak and dehydrated.  No chest pain no shortness of breath or cough.   Emesis Associated symptoms: diarrhea   Diarrhea Associated symptoms: vomiting        Home Medications Prior to Admission medications   Medication Sig Start Date End Date Taking? Authorizing Provider  Abatacept (ORENCIA) 125 MG/ML SOSY Inject 125 mg into the skin every 7 (seven) days. Saturdays    [provider]  acetaminophen (TYLENOL 8 HOUR) 650 MG CR tablet Take 1 tablet (650 mg total) by mouth every 8 (eight) hours as needed for pain or fever. 03/02/20   Derwood Kaplan, MD  amitriptyline (ELAVIL) 50 MG tablet Take 50 mg by mouth at bedtime.      [provider]  apixaban (ELIQUIS) 2.5 MG TABS tablet 1 tablet bid to prevent blood clots 08/28/18   Shepperson, Kirstin, PA-C  aspirin-acetaminophen-caffeine (EXCEDRIN MIGRAINE) [phone] MG tablet Take 2 tablets by mouth daily as needed for headache.     [provider]  Cholecalciferol (HM VITAMIN D3) 4000 UNITS CAPS Take 4,000 Units by mouth daily.      [provider]  docusate sodium (COLACE) 100 MG capsule 1 tab 2 times a day while on narcotics.  STOOL SOFTENER 08/28/18   Shepperson, Kirstin, PA-C  Dulaglutide (TRULICITY) 1.5 MG/0.5ML SOPN Inject 1.5 mg into the skin every 7 (seven) days. Sunday    [provider]  folic acid (FOLVITE) 800 MCG tablet Take  1,600 mcg by mouth daily.    [provider]  imiquimod (ALDARA) 5 % cream Apply 1 application topically 3 (three) times a week. As needed    [provider]  Insulin Detemir (LEVEMIR FLEXPEN) 100 UNIT/ML Pen Inject 30 Units into the skin every morning.     [provider]  levothyroxine (SYNTHROID, LEVOTHROID) 112 MCG tablet Take 112 mcg by mouth at bedtime.     [provider]  nadolol (CORGARD) 80 MG tablet Take 80 mg by mouth every evening.     [provider]  omeprazole (PRILOSEC) 40 MG capsule Take 40 mg by mouth 2 (two) times daily.    [provider]  Polyethyl Glyc-Propyl Glyc PF (SYSTANE ULTRA PF) 0.4-0.3 % SOLN Place 1 drop into both eyes 4 (four) times daily as needed.    [provider]  polyethylene glycol (MIRALAX / GLYCOLAX) packet 17grams in 6oz of something to drink twice a day until bowel movement.  LAXITIVE.  Restart if two days since last bowel movement 08/28/18   Shepperson, Kirstin, PA-C  pravastatin (PRAVACHOL) 40 MG tablet Take 60 mg by mouth at bedtime. Takes 1.5 tablet 03/14/16   [provider]      Allergies    Etanercept, Iodinated contrast media, Sulfamethoxazole-trimethoprim, Lisinopril, Nitrofurantoin, Canagliflozin, Fluoxetine, Ioxaglate, Nabumetone, Tramadol, Adalimumab, Metformin, Olmesartan, Penicillins, and Red dye #40 (allura red)    Review of Systems   Review  of Systems  Gastrointestinal:  Positive for diarrhea and vomiting.    Physical Exam Updated Vital Signs BP (!) 110/56 (BP Location: Left Arm)   Pulse 97   Temp 98.1 F (36.7 C) (Oral)   Resp 18   Ht 5\' 7"  (1.702 m)   Wt 64.4 kg   SpO2 96%   BMI 22.24 kg/m  Physical Exam Vitals and nursing note reviewed.  Constitutional:      General: She is not in acute distress.    Appearance: She is not ill-appearing.  HENT:     Head: Normocephalic.     Nose: Nose normal.     Mouth/Throat:     Mouth: Mucous membranes are dry.   Eyes:     Conjunctiva/sclera: Conjunctivae normal.  Cardiovascular:     Rate and Rhythm: Normal rate and regular rhythm.  Pulmonary:     Effort: Pulmonary effort is normal.  Abdominal:     General: Abdomen is flat. There is no distension.     Tenderness: There is no abdominal tenderness. There is no guarding or rebound.  Musculoskeletal:        General: Normal range of motion.  Skin:    General: Skin is warm and dry.     Capillary Refill: Capillary refill takes less than 2 seconds.  Neurological:     Mental Status: She is alert and oriented to person, place, and time.  Psychiatric:        Mood and Affect: Mood normal.        Behavior: Behavior normal.     ED Results / Procedures / Treatments   Labs (all labs ordered are listed, but only abnormal results are displayed) Labs Reviewed  COMPREHENSIVE METABOLIC PANEL - Abnormal; Notable for the following components:      Result Value   Sodium 133 (*)    CO2 19 (*)    Glucose, Bld 181 (*)    Creatinine, Ser 1.19 (*)    Total Protein 5.9 (*)    Albumin 3.1 (*)    GFR, Estimated 47 (*)    All other components within normal limits  RESP PANEL BY RT-PCR (RSV, FLU A&B, COVID)  RVPGX2  LIPASE, BLOOD  CBC    EKG EKG Interpretation Date/Time:  Sunday October 14 2023 08:13:28 EST Ventricular Rate:  94 PR Interval:  179 QRS Duration:  88 QT Interval:  346 QTC Calculation: 433 R Axis:   15  Text Interpretation: Sinus rhythm Low voltage, precordial leads ST elevation, consider inferior injury Confirmed by Estanislado Pandy [phone]) on 10/14/2023 8:57:22 AM  Radiology No results found.  Procedures Procedures    Medications Ordered in ED Medications  ondansetron (ZOFRAN-ODT) disintegrating tablet 4 mg (4 mg Oral Given 10/14/23 0543)  loperamide (IMODIUM) capsule 4 mg (4 mg Oral Given 10/14/23 0553)  sodium chloride 0.9 % bolus 1,000 mL (0 mLs Intravenous Stopped 10/14/23 0845)    ED Course/ Medical Decision Making/  A&P Clinical Course as of 10/14/23 0958  Sun Oct 14, 2023  0957 Patient feeling much improved after liter of IV fluids.  Flu/COVID/RSV negative.  She would like to go home.  She is not having any urinary symptoms and does not want to wait to give urine sample.  She understands that cannot exclude UTI.  She states she will follow-up with her primary doctor. Discussed supportive care with patient for likely viral gastroenteritis.  Will discharge with Zofran. [TY]    Clinical Course User Index [TY] Coral Spikes, DO  Medical Decision Making Well-appearing 77 year old female presenting emergency department with gastroenteritis type picture with nausea vomiting diarrhea x 4 days.  Mildly elevated heart rate on arrival.  Clinically looks slightly dry.  Has a benign abdominal exam and has had some improvement with Zofran and Imodium.  Labs with some evidence of dehydration with mild hyponatremia and mild elevation in creatinine.  No transaminitis to suggest hepatobiliary disease.  Lipase normal.  Pancreatitis unlikely.  She has no fever or leukocytosis to suggest systemic infection.  Given constellation of symptoms concern for possible viral etiology such as flu or COVID.  Viral panel is pending.  Also awaiting UA.  Given patient's clinical signs of dehydration we will give IV fluids awaiting respiratory panel and UA.    Amount and/or Complexity of Data Reviewed Independent Historian:     Details: Husband notes patient took Imodium last night External Data Reviewed:     Details: Per chart review has surgical history of appendectomy and cholecystectomy as well as hysterectomy. Labs: ordered. Radiology:     Details: Considered CT of the abdomen, however history and exam and lab point towards a viral etiology.  Low suspicion for acute surgical intra-abdominal pathology at this time.  Will forego CT scan. ECG/medicine tests: ordered.  Risk Prescription drug  management.         Final Clinical Impression(s) / ED Diagnoses Final diagnoses:  None    Rx / DC Orders ED Discharge Orders     None         Coral Spikes, DO 10/14/23 1610

## 2023-10-14 NOTE — ED Triage Notes (Signed)
NVD since X 4 days, tried OTC meds with some relief.

## 2023-10-14 NOTE — Discharge Instructions (Signed)
As discussed please maintain adequate hydration with frequent sips of clear liquids such as Pedialyte or low sugar Gatorade.  May take Zofran as needed for nausea and vomiting as well as your home Imodium.  Please follow-up with your primary doctor.  Return immediately for fevers, chills, abdominal pain, lightheadedness, passout, chest pain, shortness of breath or any new or worsening symptoms that are concerning to you

## 2023-10-14 NOTE — ED Notes (Signed)
ED Provider at bedside. 

## 2023-11-27 ENCOUNTER — Encounter (HOSPITAL_COMMUNITY): Payer: Medicare HMO

## 2023-12-03 ENCOUNTER — Other Ambulatory Visit (HOSPITAL_COMMUNITY): Payer: Self-pay | Admitting: *Deleted

## 2023-12-04 ENCOUNTER — Ambulatory Visit (HOSPITAL_COMMUNITY)
Admission: RE | Admit: 2023-12-04 | Discharge: 2023-12-04 | Disposition: A | Discharge status: Home / Self Care | Payer: PPO | Source: Ambulatory Visit | Attending: Internal Medicine | Admitting: Internal Medicine

## 2023-12-04 DIAGNOSIS — M810 Age-related osteoporosis without current pathological fracture: Secondary | ICD-10-CM | POA: Insufficient documentation

## 2023-12-04 MED ORDER — ZOLEDRONIC ACID 5 MG/100ML IV SOLN
INTRAVENOUS | Status: AC
  Administered 2023-12-04: 5 mg via INTRAVENOUS
  Filled 2023-12-04: qty 100

## 2023-12-04 MED ORDER — ZOLEDRONIC ACID 5 MG/100ML IV SOLN: 5.0000 mg | Freq: Once | INTRAVENOUS | Status: AC

## 2023-12-10 ENCOUNTER — Encounter (HOSPITAL_COMMUNITY): Payer: Medicare HMO

## 2024-01-01 ENCOUNTER — Other Ambulatory Visit: Payer: Self-pay

## 2024-01-01 ENCOUNTER — Encounter: Payer: Self-pay | Admitting: Neurology

## 2024-01-01 DIAGNOSIS — R202 Paresthesia of skin: Secondary | ICD-10-CM

## 2024-01-08 ENCOUNTER — Ambulatory Visit (INDEPENDENT_AMBULATORY_CARE_PROVIDER_SITE_OTHER): Admitting: Neurology

## 2024-01-08 DIAGNOSIS — R202 Paresthesia of skin: Secondary | ICD-10-CM | POA: Diagnosis not present

## 2024-01-08 DIAGNOSIS — M5412 Radiculopathy, cervical region: Secondary | ICD-10-CM

## 2024-01-08 NOTE — Procedures (Signed)
 [facility]  [address] [place], [address]  [place], [place] [address] Tel: [phone] Fax: [phone] Test Date:  01/08/2024  Patient: Sara Diaz DOB: 04/19/1946 Physician: Jacquelyne Balint, MD  Sex: Female Height: 5\' 7"  Ref Phys: Val Eagle, NP  ID#: [id number]   Technician:    History: This is a 78 year old female with right arm pain.  NCV & EMG Findings: Extensive electrodiagnostic evaluation of the right upper limb with additional nerve conduction studies in the left upper limb shows: Right median, ulnar, and radial sensory responses are within normal limits. Bilateral ulnar (ADM) motor responses show reduced amplitude (L6.5, R6.1 mV). Right median (APB) motor response is within normal limits. Chronic motor axon loss changes without accompanying active denervation changes are seen in the right first dorsal interosseous, extensor indicis proprius, abductor pollicis brevis, pronator teres, and triceps muscles. Cervical paraspinal muscles were not evaluated due to prior cervical spine surgery.  Impression: This is an abnormal study. The findings are most consistent with the following: The residuals of old intraspinal canal lesions (ie: motor radiculopathy) at the right C7, C8, and likely also T1 roots or segments. The findings are mild in degree electrically at right C8 and T1 and moderate to severe at right C7. No electrodiagnostic evidence of a right median mononeuropathy at or distal to the wrist (ie: carpal tunnel syndrome). Screening studies for right ulnar or radial mononeuropathies are normal.    ___________________________ Jacquelyne Balint, MD    Nerve Conduction Studies Motor Nerve Results    Latency Amplitude F-Lat Segment Distance CV Comment  Site (ms) Norm (mV) Norm (ms)  (cm) (m/s) Norm   Right Median (APB) Motor  Wrist 2.4  < 4.0 9.9  > 5.0        Elbow 6.6 - 9.7 -  Elbow-Wrist 25 60  > 50   Left Ulnar (ADM) Motor  Wrist 2.2  < 3.1 *6.5  > 7.0         Right Ulnar (ADM) Motor  Wrist 1.75  < 3.1 *6.1  > 7.0        Bel elbow 5.3 - 5.5 -  Bel elbow-Wrist 19.5 54  > 50   Ab elbow 7.1 - 5.3 -  Ab elbow-Bel elbow 10 56 -   Post exercise 1.83 - 6.5 -         Sensory Sites    Neg Peak Lat Amplitude (O-P) Segment Distance Velocity Comment  Site (ms) Norm (V) Norm  (cm) (ms)   Right Median Sensory  Wrist 3.3  < 3.8 18  > 10 Wrist-Dig II 13    Right Radial Sensory  Forearm-Wrist 2.3  < 2.8 20  > 10 Forearm-Wrist 10    Right Ulnar Sensory  Wrist-Dig V 2.8  < 3.2 15  > 5 Wrist-Dig V 11     Electromyography   Side Muscle Ins.Act Fibs Fasc Recrt Amp Dur Poly Activation Comment  Right FDI Nml Nml Nml *1- *1+ *1+ Nml Nml N/A  Right EIP Nml Nml Nml *1- *1+ *1+ Nml Nml N/A  Right Pronator teres Nml Nml Nml *3- *2+ *2+ *1+ Nml N/A  Right Biceps Nml Nml Nml Nml Nml Nml Nml Nml N/A  Right Triceps Nml Nml Nml *2- *1+ *1+ Nml Nml N/A  Right Deltoid Nml Nml Nml Nml Nml Nml Nml Nml N/A  Right APB Nml Nml Nml *1- *1+ *1+ Nml Nml N/A      Waveforms:  Motor  Sensory

## 2024-01-28 ENCOUNTER — Encounter: Admitting: Neurology

## 2024-01-29 ENCOUNTER — Other Ambulatory Visit: Payer: Self-pay | Admitting: Neurosurgery

## 2024-02-05 ENCOUNTER — Other Ambulatory Visit (HOSPITAL_COMMUNITY)

## 2024-02-05 ENCOUNTER — Encounter (HOSPITAL_COMMUNITY)
Admission: RE | Admit: 2024-02-05 | Discharge: 2024-02-05 | Disposition: A | Discharge status: Home / Self Care | Source: Ambulatory Visit | Attending: Neurosurgery | Admitting: Neurosurgery

## 2024-02-05 ENCOUNTER — Other Ambulatory Visit: Payer: Self-pay

## 2024-02-05 ENCOUNTER — Encounter (HOSPITAL_COMMUNITY): Payer: Self-pay

## 2024-02-05 VITALS — BP 115/67 | HR 81 | Temp 98.0°F | Resp 16 | Ht 67.0 in | Wt 137.5 lb

## 2024-02-05 DIAGNOSIS — R0609 Other forms of dyspnea: Secondary | ICD-10-CM | POA: Insufficient documentation

## 2024-02-05 DIAGNOSIS — I10 Essential (primary) hypertension: Secondary | ICD-10-CM | POA: Insufficient documentation

## 2024-02-05 DIAGNOSIS — E785 Hyperlipidemia, unspecified: Secondary | ICD-10-CM | POA: Insufficient documentation

## 2024-02-05 DIAGNOSIS — Z86711 Personal history of pulmonary embolism: Secondary | ICD-10-CM | POA: Diagnosis not present

## 2024-02-05 DIAGNOSIS — Z794 Long term (current) use of insulin: Secondary | ICD-10-CM | POA: Diagnosis not present

## 2024-02-05 DIAGNOSIS — Z01812 Encounter for preprocedural laboratory examination: Secondary | ICD-10-CM | POA: Diagnosis present

## 2024-02-05 DIAGNOSIS — M5412 Radiculopathy, cervical region: Secondary | ICD-10-CM | POA: Diagnosis not present

## 2024-02-05 DIAGNOSIS — Z86718 Personal history of other venous thrombosis and embolism: Secondary | ICD-10-CM | POA: Insufficient documentation

## 2024-02-05 DIAGNOSIS — E7800 Pure hypercholesterolemia, unspecified: Secondary | ICD-10-CM | POA: Insufficient documentation

## 2024-02-05 DIAGNOSIS — E119 Type 2 diabetes mellitus without complications: Secondary | ICD-10-CM | POA: Diagnosis not present

## 2024-02-05 DIAGNOSIS — M069 Rheumatoid arthritis, unspecified: Secondary | ICD-10-CM | POA: Insufficient documentation

## 2024-02-05 DIAGNOSIS — Z01818 Encounter for other preprocedural examination: Secondary | ICD-10-CM

## 2024-02-05 DIAGNOSIS — E039 Hypothyroidism, unspecified: Secondary | ICD-10-CM | POA: Insufficient documentation

## 2024-02-05 DIAGNOSIS — R531 Weakness: Secondary | ICD-10-CM | POA: Insufficient documentation

## 2024-02-05 LAB — BASIC METABOLIC PANEL WITH GFR
Anion gap: 9 (ref 5–15)
BUN: 20 mg/dL (ref 8–23)
CO2: 25 mmol/L (ref 22–32)
Calcium: 9.5 mg/dL (ref 8.9–10.3)
Chloride: 107 mmol/L (ref 98–111)
Creatinine, Ser: 1.13 mg/dL — ABNORMAL HIGH (ref 0.44–1.00)
GFR, Estimated: 50 mL/min — ABNORMAL LOW (ref >60)
Glucose, Bld: 91 mg/dL (ref 70–99)
Potassium: 4.5 mmol/L (ref 3.5–5.1)
Sodium: 141 mmol/L (ref 135–145)

## 2024-02-05 LAB — CBC
HCT: 38.7 % (ref 36.0–46.0)
Hemoglobin: 12.4 g/dL (ref 12.0–15.0)
MCH: 30.2 pg (ref 26.0–34.0)
MCHC: 32 g/dL (ref 30.0–36.0)
MCV: 94.2 fL (ref 80.0–100.0)
Platelets: 281 10*3/uL (ref 150–400)
RBC: 4.11 MIL/uL (ref 3.87–5.11)
RDW: 15.5 % (ref 11.5–15.5)
WBC: 8.7 10*3/uL (ref 4.0–10.5)
nRBC: 0 % (ref 0.0–0.2)

## 2024-02-05 LAB — GLUCOSE, CAPILLARY: Glucose-Capillary: 105 mg/dL — ABNORMAL HIGH (ref 70–99)

## 2024-02-05 LAB — SURGICAL PCR SCREEN
MRSA, PCR: NEGATIVE
Staphylococcus aureus: NEGATIVE

## 2024-02-05 NOTE — Progress Notes (Signed)
 Surgical Instructions   Your procedure is scheduled on April 28. Report to [facility] Main Entrance "A" at 6:00 A.M., then check in with the Admitting office. Any questions or running late day of surgery: call [phone]  Questions prior to your surgery date: call [phone], Monday-Friday, 8am-4pm. If you experience any cold or flu symptoms such as cough, fever, chills, shortness of breath, etc. between now and your scheduled surgery, please notify us  at the above number.     Remember:  Do not eat or drink anything after midnight the night before your surgery   Take these medicines the morning of surgery with A SIP OF WATER  Artificial Tear Solution (GENTEAL TEARS)  cycloSPORINE  (VEVYE )  levothyroxine  (SYNTHROID )  NIFEdipine (PROCARDIA-XL/NIFEDICAL-XL)  omeprazole (PRILOSEC)   May take these medicines IF NEEDED: acetaminophen  (TYLENOL  8 HOUR)  albuterol  (VENTOLIN  HFA) 108 (90 Base) MCG/ACT inhaler -bring inhaler to the hospital benzonatate (TESSALON)  traMADol  (ULTRAM )    One week prior to surgery, STOP taking any Aspirin  (unless otherwise instructed by your surgeon) Aleve, Naproxen, Ibuprofen , Motrin , Advil , Goody's, BC's, all herbal medications, fish oil, and non-prescription vitamins.          WHAT DO I DO ABOUT MY DIABETES MEDICATION?   HOLD OZEMPIC FOR 7 DAYS PRIOR TO SURGERY. DO NOT TAKE OZEMPIC AFTER 02/03/24.   THE MORNING OF SURGERY, take 14 units of (TOUJEO MAX SOLOSTAR insulin .  HOW TO MANAGE YOUR DIABETES BEFORE AND AFTER SURGERY  Why is it important to control my blood sugar before and after surgery? Improving blood sugar levels before and after surgery helps healing and can limit problems. A way of improving blood sugar control is eating a healthy diet by:  Eating less sugar and carbohydrates  Increasing activity/exercise  Talking with your doctor about reaching your blood sugar goals High blood sugars (greater than 180 mg/dL) can raise your risk of  infections and slow your recovery, so you will need to focus on controlling your diabetes during the weeks before surgery. Make sure that the doctor who takes care of your diabetes knows about your planned surgery including the date and location.  How do I manage my blood sugar before surgery? Check your blood sugar at least 4 times a day, starting 2 days before surgery, to make sure that the level is not too high or low.  Check your blood sugar the morning of your surgery when you wake up and every 2 hours until you get to the Short Stay unit.  If your blood sugar is less than 70 mg/dL, you will need to treat for low blood sugar: Do not take insulin . Treat a low blood sugar (less than 70 mg/dL) with  cup of clear juice (cranberry or apple), 4 glucose tablets, OR glucose gel. Recheck blood sugar in 15 minutes after treatment (to make sure it is greater than 70 mg/dL). If your blood sugar is not greater than 70 mg/dL on recheck, call [phone] for further instructions. Report your blood sugar to the short stay nurse when you get to Short Stay.  If you are admitted to the hospital after surgery: Your blood sugar will be checked by the staff and you will probably be given insulin  after surgery (instead of oral diabetes medicines) to make sure you have good blood sugar levels. The goal for blood sugar control after surgery is 80-180 mg/dL.            Do NOT Smoke (Tobacco/Vaping) for 24 hours prior to your procedure.  If you use a CPAP at night, you may bring your mask/headgear for your overnight stay.   You will be asked to remove any contacts, glasses, piercing's, hearing aid's, dentures/partials prior to surgery. Please bring cases for these items if needed.    Patients discharged the day of surgery will not be allowed to drive home, and someone needs to stay with them for 24 hours.  SURGICAL WAITING ROOM VISITATION Patients may have no more than 2 support people in the waiting area -  these visitors may rotate.   Pre-op nurse will coordinate an appropriate time for 1 ADULT support person, who may not rotate, to accompany patient in pre-op.  Children under the age of 24 must have an adult with them who is not the patient and must remain in the main waiting area with an adult.  If the patient needs to stay at the hospital during part of their recovery, the visitor guidelines for inpatient rooms apply.  Please refer to the [facility] website for the visitor guidelines for any additional information.   If you received a COVID test during your pre-op visit  it is requested that you wear a mask when out in public, stay away from anyone that may not be feeling well and notify your surgeon if you develop symptoms. If you have been in contact with anyone that has tested positive in the last 10 days please notify you surgeon.      Pre-operative 5 CHG Bathing Instructions   You can play a key role in reducing the risk of infection after surgery. Your skin needs to be as free of germs as possible. You can reduce the number of germs on your skin by washing with CHG (chlorhexidine  gluconate) soap before surgery. CHG is an antiseptic soap that kills germs and continues to kill germs even after washing.   DO NOT use if you have an allergy to chlorhexidine /CHG or antibacterial soaps. If your skin becomes reddened or irritated, stop using the CHG and notify one of our RNs at [phone].   Please shower with the CHG soap starting 4 days before surgery using the following schedule:     Please keep in mind the following:  DO NOT shave, including legs and underarms, starting the day of your first shower.   You may shave your face at any point before/day of surgery.  Place clean sheets on your bed the day you start using CHG soap. Use a clean washcloth (not used since being washed) for each shower. DO NOT sleep with pets once you start using the CHG.   CHG Shower Instructions:  Wash  your face and private area with normal soap. If you choose to wash your hair, wash first with your normal shampoo.  After you use shampoo/soap, rinse your hair and body thoroughly to remove shampoo/soap residue.  Turn the water OFF and apply about 3 tablespoons (45 ml) of CHG soap to a CLEAN washcloth.  Apply CHG soap ONLY FROM YOUR NECK DOWN TO YOUR TOES (washing for 3-5 minutes)  DO NOT use CHG soap on face, private areas, open wounds, or sores.  Pay special attention to the area where your surgery is being performed.  If you are having back surgery, having someone wash your back for you may be helpful. Wait 2 minutes after CHG soap is applied, then you may rinse off the CHG soap.  Pat dry with a clean towel  Put on clean clothes/pajamas   If you choose  to wear lotion, please use ONLY the CHG-compatible lotions that are listed below.  Additional instructions for the day of surgery: DO NOT APPLY any lotions, deodorants, cologne, or perfumes.   Do not bring valuables to the hospital. [facility] is not responsible for any belongings/valuables. Do not wear nail polish, gel polish, artificial nails, or any other type of covering on natural nails (fingers and toes) Do not wear jewelry or makeup Put on clean/comfortable clothes.  Please brush your teeth.  Ask your nurse before applying any prescription medications to the skin.     CHG Compatible Lotions   Aveeno Moisturizing lotion  Cetaphil Moisturizing Cream  Cetaphil Moisturizing Lotion  Clairol Herbal Essence Moisturizing Lotion, Dry Skin  Clairol Herbal Essence Moisturizing Lotion, Extra Dry Skin  Clairol Herbal Essence Moisturizing Lotion, Normal Skin  Curel Age Defying Therapeutic Moisturizing Lotion with Alpha Hydroxy  Curel Extreme Care Body Lotion  Curel Soothing Hands Moisturizing Hand Lotion  Curel Therapeutic Moisturizing Cream, Fragrance-Free  Curel Therapeutic Moisturizing Lotion, Fragrance-Free  Curel Therapeutic  Moisturizing Lotion, Original Formula  Eucerin Daily Replenishing Lotion  Eucerin Dry Skin Therapy Plus Alpha Hydroxy Crme  Eucerin Dry Skin Therapy Plus Alpha Hydroxy Lotion  Eucerin Original Crme  Eucerin Original Lotion  Eucerin Plus Crme Eucerin Plus Lotion  Eucerin TriLipid Replenishing Lotion  Keri Anti-Bacterial Hand Lotion  Keri Deep Conditioning Original Lotion Dry Skin Formula Softly Scented  Keri Deep Conditioning Original Lotion, Fragrance Free Sensitive Skin Formula  Keri Lotion Fast Absorbing Fragrance Free Sensitive Skin Formula  Keri Lotion Fast Absorbing Softly Scented Dry Skin Formula  Keri Original Lotion  Keri Skin Renewal Lotion Keri Silky Smooth Lotion  Keri Silky Smooth Sensitive Skin Lotion  Nivea Body Creamy Conditioning Oil  Nivea Body Extra Enriched Lotion  Nivea Body Original Lotion  Nivea Body Sheer Moisturizing Lotion Nivea Crme  Nivea Skin Firming Lotion  NutraDerm 30 Skin Lotion  NutraDerm Skin Lotion  NutraDerm Therapeutic Skin Cream  NutraDerm Therapeutic Skin Lotion  ProShield Protective Hand Cream  Provon moisturizing lotion  Please read over the following fact sheets that you were given.

## 2024-02-05 NOTE — Progress Notes (Signed)
 PCP - Ruel Cotta, Heinz Llano, MD   Cardiologist -  Bethany Broom. Cheek  PPM/ICD - denies Device Orders -  Rep Notified -   Chest x-ray - Chest CT 12-11-23 EKG - 09-17-23 Stress Test - 07-15-18 ECHO - 12-10-23 Cardiac Cath -   Sleep Study - denies CPAP - n/a  Fasting Blood Sugar - Per patient 104-250 Depending on what she eats Checks Blood Sugar - Has Dexcom to right arm A1c 6.6 on 01-10-24     No data to display           Last dose of GLP1 agonist-  OZEMPIC, Last dose 01-15-24 GLP1 instructions: yes  Blood Thinner Instructions:denies Aspirin  Instructions:n/a  ERAS Protcol -NPO PRE-SURGERY Ensure or G2-   COVID TEST- no   Anesthesia review:yes recent URI    Patient agrees to go over the instructions while at home for a better understanding. Patient also instructed to self quarantine after being tested for COVID-19. The opportunity to ask questions was provided.

## 2024-02-06 NOTE — Progress Notes (Signed)
 Anesthesia Chart Review:  Case: [id number] Date/Time: 02/11/24 0745   Procedure: ANTERIOR CERVICAL DECOMPRESSION/DISCECTOMY FUSION 1 LEVEL - ACDF - C6-C7   Anesthesia type: General   Diagnosis: Radiculopathy, cervical region [M54.12]   Pre-op diagnosis: Radiculopathy, cervical region   [facility] / [facility]   Surgeons: Agustina Aldrich, MD       DISCUSSION: Patient is a 78 year old female scheduled for the above procedure.  History includes never smoker, PONV, HTN, hypercholesterolemia, PE/DVT (during pregnancy, last at age 38), DM2, hypothyroidism, RA, GERD, chronic exertional dyspnea, right leg weakness, spinal surgery (L4 laminectomy 09/04/11; C4-6 ACDF 05/17/17, revision 06/01/17), osteoarthritis (left TKA 08/26/18).   Last cardiology follow-up at [facility] with Ena Harries, DNP, FNP-C on 12/13/23 for follow-up chronic SOB/DOE, HTN, HLD. She also notes chronic BLE edema/lymphedema with prior vein specialist evaluation with "not treatment options to offer." HTN controlled. Most recent testing as below. She wrote, "As she does not have a definitive cardiac diagnosis I offered as needed follow-up however she would prefer to follow annually." - TTE (ordered by PCP for fatigue) 11/20/23 reassuring with LVEF 60-65%. No significant valve disease. Normal LV size. Upper septal hypertrophy, sigmoid septum, normal variant. She did have some ungraded diastolic dysfunction but otherwise unremarkable study. No definitive echocardiographic evidence of HFpEF. - Holter monitor (ordered by PCP) 11/13/2023: HR 58-147, average 87 bpm, predominant SR, 3 brief SVT runs up to 7 beasts, < 1% SVE/VE ectopy. - NM stress test 06/29/2022 noted no evidence of prior damage or inducible ischemia.   New patient pulmonology evaluation on 01/18/24 by Dr. Annamaria Barrette. 12/12/23 PFTs as below. He wrote, "Overall I feel the confusion from a pure pulmonary source is low based on the ancillary data available however we will attempt maximum  bronchodilation trial, will give samples of triple agent inhalers and repeat PFTs, given evidence of diastolic dysfunction strongly recommend looking into OSA..."  He recommended an in lab OSA evaluation. Two month follow-up planned with PFTs/Sleep Study.   She was seen by primary care on 01/28/24 for dry cough x 5 day. She stopped Breztri since her cough started after using it. However with runny nose, thought possibly viral illness versus allergies/pollen). Lung sounds were clear.  Flu and COVID negative. She was prescribed Tessalon Perles and albuterol  MDI. She denied fever. She reported symptoms resolved within 48 hours later.    A1c 6.6% on 01/10/2024.  She wears a Dexcom CGM. Last Ozempic 01/15/24.   Anesthesia team to evaluate on the day of surgery. EKG not repeated at February 2025 cardiology visit. Defer to anesthesia team if repeat EKG desired.    VS: BP 115/67   Pulse 81   Temp 36.7 C   Resp 16   Ht 5\' 7"  (1.702 m)   Wt 62.4 kg   SpO2 99%   BMI 21.54 kg/m   PROVIDERS: Ruel Cotta, Heinz Llano, MD is PCP  Huel Madison, MD is Rheumatologist Wilbert Hand, MD is Cardiologist Suella Emmer, MD is Pulmonologist Donis Furnish, FNP is Endocrinology provider   LABS: Labs reviewed: Acceptable for surgery. (all labs ordered are listed, but only abnormal results are displayed)  Labs Reviewed  GLUCOSE, CAPILLARY - Abnormal; Notable for the following components:      Result Value   Glucose-Capillary 105 (*)    All other components within normal limits  BASIC METABOLIC PANEL WITH GFR - Abnormal; Notable for the following components:   Creatinine, Ser 1.13 (*)    GFR, Estimated 50 (*)  All other components within normal limits  SURGICAL PCR SCREEN  CBC    PFTs 2/26/2-25 ([facility]): As outlined by Dr. Annamaria Barrette "PFTs done 12/12/2023 prebronchodilator indices were normal, postbronchodilator showed a reduced ratio and FEV1 of 67% questioning obstructive lung disease, lifelong non-smoker,  potential COPD/asthma indicated"   IMAGES: CT Chest 12/11/23 ([facility]): IMPRESSION: 1. No acute intrathoracic abnormality or explanation for shortness  of breath.  2. Minor subsegmental atelectasis in the lingula and both lower  lobes.  3. Coronary artery calcifications.  - Aortic Atherosclerosis (ICD10-I70.0).   MRI T-spine 12/07/23 ([facility]): 1. Chronic inferior endplate compression fracture of the T9  vertebral body without progression of height loss or residual bone  marrow edema.  2. No acute fractures.  3. Mild multilevel thoracic spondylosis. No canal stenosis at any  level. Mild foraminal narrowing on the right at the T2-3 through  T5-6 levels and on the left at T9-T10.    EKG: EKG 10/14/23 (ED visit for N/V/D x 4 days, dehydration, no chest pain, discharged with likely viral gastroenteritis diagnosis): Sinus rhythm Low voltage, precordial leads ST elevation, consider inferior injury Confirmed by Elise Guile [phone]) on 10/14/2023 8:57:22 AM   - Baseline wanderer noted, worse in limb leads   CV: TTE 11/20/2023 ([facility]): SUMMARY  Upper septal hypertrophy  sigmoid septum , normal variant.  The left ventricular size is normal.  Left ventricular systolic function is normal.  LV ejection fraction = 60-65%.  Left ventricular filling pattern is prolonged relaxation.  The right ventricle is normal in size and function.  There is no significant valvular stenosis or regurgitation.  IVC size was normal.  There is no pericardial effusion.  There is no comparison study available.    8-14 Day Holter monitor 11/13/2023 ([facility]): FINDINGS  Patient had a min HR of 58 bpm, max HR of 147 bpm, and avg HR of 87 bpm. Predominant underlying rhythm was Sinus Rhythm. 3 Supraventricular Tachycardia runs occurred, the run with the fastest interval lasting 6 beats with a max rate of 136 bpm, the longest lasting 7 beats with an avg rate of 102 bpm. Isolated SVEs were rare  <1.0% ,  SVE Couplets were rare <1.0%, and SVE Triplets were rare  <1.0% . Isolated VEs were rare  <1.0%, and no VE Couplets or VE Triplets were present.    LE Venous US  11/13/2023 ([facility]): Conclusion  Right No evidence of deep vein thrombosis or venous obstruction in the lower extremity. Spot imaging of the posterior tibial artery revealed normal, triphasic flow.  Left  No evidence of deep vein thrombosis or venous obstruction in the lower extremity. Spot imaging of the posterior tibial artery revealed normal, triphasic flow.    Nuclear stress test 9/14/20243 ([facility]): IMPRESSION:  1. Good quality study.  2.  Normal EKG and hemodynamic response to Lexiscan stress.  3.  Normal resting perfusion with no evidence of old infarct.  4.  Normal stress perfusion with no evidence of inducible ischemia.  5.  Normal LV systolic function with EF 87%.    Past Medical History:  Diagnosis Date   Arthritis    "neck ,hands, back" (05/17/2017)   Barrett esophagus    Complication of anesthesia nausea and vomiting    Depression    DVT (deep vein thrombosis) in pregnancy 1973   BLE; "I had a 10# pregnancy"   Dyspnea    with exertion   GERD (gastroesophageal reflux disease)    off prilosec .developed cough .  thought to be allergic  to prilosec   Headache(784.0)    "stopped having them daily after back OR in 2012; restarted in 2017; had 1-2/month" (05/17/2017)   High cholesterol    History of kidney stones 2006,2007   Hypertension    Hypothyroidism    Neuromuscular disorder (HCC)    weakness r leg.   Osteoporosis    Pneumonia 2015-2016 X 2   PONV (postoperative nausea and vomiting)    Pulmonary embolism (HCC) 1973 X 2   RA (rheumatoid arthritis) (HCC)    Type II diabetes mellitus (HCC)    Type II    Past Surgical History:  Procedure Laterality Date   ABDOMINAL ADHESION SURGERY  1992   ABDOMINAL HYSTERECTOMY  1990   ANTERIOR CERVICAL DECOMP/DISCECTOMY FUSION  05/17/2017   ANTERIOR CERVICAL  DECOMP/DISCECTOMY FUSION N/A 05/17/2017   Procedure: Anterior Cervical Decompression/Discectomy Fusion - Cervical four-Cervical five - Cervical five-Cervical six;  Surgeon: Agustina Aldrich, MD;  Location: [facility] OR;  Service: Neurosurgery;  Laterality: N/A;   ANTERIOR CERVICAL DECOMP/DISCECTOMY FUSION N/A 06/01/2017   Procedure: Revision on Anterior Cervical Decompression/Discectomy  Cervical four-six;  Surgeon: Agustina Aldrich, MD;  Location: [facility] OR;  Service: Neurosurgery;  Laterality: N/A;   APPENDECTOMY     BACK SURGERY     BREAST SURGERY Right 1968   2 tumors on breast -benign   BREAST SURGERY Right 1980s   1 tumor on breast -benign   CHOLECYSTECTOMY OPEN     CYST EXCISION Left 2001   index finger   CYSTOSCOPY W/ STONE MANIPULATION  2006-2007 X 3   DILATION AND CURETTAGE OF UTERUS  1984   ENTEROCELE REPAIR  2007   ESOPHAGOGASTRODUODENOSCOPY (EGD) WITH ESOPHAGEAL DILATION  4-5 Xs   EYE SURGERY     INCONTINENCE SURGERY  1991; 1993   JOINT REPLACEMENT     JOINT REPLACEMENT Left 2017   thumb; "replaced joint w/tendon"   KNEE ARTHROSCOPY Right    LAMINECTOMY  09/04/2011   Procedure: LUMBAR LAMINECTOMY FOR TUMOR;  Surgeon: Awilda Bogus Pool;  Location: [facility];  Service: Neurosurgery;  Laterality: N/A;  Lumbar five laminectomy for resection of intradural tumor   REFRACTIVE SURGERY Bilateral    TENDON REPAIR Left 2017   ruptured tendon on thumb 2 wk after joint replaced w/tendon   TOTAL KNEE ARTHROPLASTY Left 08/26/2018   TOTAL KNEE ARTHROPLASTY Left 08/26/2018   Procedure: TOTAL KNEE ARTHROPLASTY;  Surgeon: Elly Habermann, MD;  Location: [facility] OR;  Service: Orthopedics;  Laterality: Left;    MEDICATIONS:  acetaminophen  (TYLENOL  8 HOUR) 650 MG CR tablet   albuterol  (VENTOLIN  HFA) 108 (90 Base) MCG/ACT inhaler   amitriptyline  (ELAVIL ) 50 MG tablet   Artificial Tear Solution (GENTEAL TEARS) 0.1-0.2-0.3 % SOLN   benzonatate (TESSALON) 200 MG capsule   Cholecalciferol  25 MCG (1000 UT) tablet    cycloSPORINE  (VEVYE ) 0.1 % SOLN   folic acid  (FOLVITE ) 800 MCG tablet   hydroquinone 4 % cream   insulin  glargine, 2 Unit Dial, (TOUJEO MAX SOLOSTAR) 300 UNIT/ML Solostar Pen   levothyroxine  (SYNTHROID ) 75 MCG tablet   methotrexate (RHEUMATREX) 2.5 MG tablet   NIFEdipine (PROCARDIA-XL/NIFEDICAL-XL) 30 MG 24 hr tablet   omeprazole (PRILOSEC) 40 MG capsule   OZEMPIC, 0.25 OR 0.5 MG/DOSE, 2 MG/3ML SOPN   pravastatin  (PRAVACHOL ) 40 MG tablet   telmisartan (MICARDIS) 40 MG tablet   traMADol  (ULTRAM ) 50 MG tablet   No current facility-administered medications for this encounter.    Ella Gun, PA-C Surgical Short Stay/Anesthesiology [facility]  Phone [phone] [facility] Phone [phone] 02/06/2024 2:43 PM

## 2024-02-06 NOTE — Anesthesia Preprocedure Evaluation (Addendum)
 Anesthesia Evaluation  Patient identified by MRN, date of birth, ID band Patient awake    Reviewed: Allergy & Precautions, NPO status , Patient's Chart, lab work & pertinent test results  History of Anesthesia Complications (+) PONV and history of anesthetic complications  Airway Mallampati: III  TM Distance: >3 FB Neck ROM: Limited    Dental  (+) Missing   Pulmonary PE   Pulmonary exam normal        Cardiovascular hypertension, Pt. on medications + DVT  Normal cardiovascular exam     Neuro/Psych  Headaches PSYCHIATRIC DISORDERS  Depression     Neuromuscular disease    GI/Hepatic Neg liver ROS,GERD  Medicated and Controlled,,  Endo/Other  diabetes, Type 2, Insulin  DependentHypothyroidism  Patient on GLP-1 Agonist  Renal/GU negative Renal ROS     Musculoskeletal  (+) Arthritis , Rheumatoid disorders,    Abdominal   Peds  Hematology negative hematology ROS (+)   Anesthesia Other Findings Radiculopathy, cervical region  Reproductive/Obstetrics                             Anesthesia Physical Anesthesia Plan  ASA: 3  Anesthesia Plan: General   Post-op Pain Management: Tylenol  PO (pre-op)*   Induction: Intravenous  PONV Risk Score and Plan: 4 or greater and Treatment may vary due to age or medical condition, Ondansetron , Dexamethasone , Propofol  infusion and Amisulpride  Airway Management Planned: Oral ETT and Video Laryngoscope Planned  Additional Equipment: None  Intra-op Plan:   Post-operative Plan: Extubation in OR  Informed Consent: I have reviewed the patients History and Physical, chart, labs and discussed the procedure including the risks, benefits and alternatives for the proposed anesthesia with the patient or authorized representative who has indicated his/her understanding and acceptance.     Dental advisory given  Plan Discussed with: CRNA  Anesthesia Plan  Comments: (PAT note written 02/06/2024 by Allison Zelenak, PA-C.  )       Anesthesia Quick Evaluation

## 2024-02-11 ENCOUNTER — Observation Stay (HOSPITAL_COMMUNITY)
Admission: RE | Admit: 2024-02-11 | Discharge: 2024-02-12 | Disposition: A | Discharge status: Home / Self Care | Attending: Neurosurgery | Admitting: Neurosurgery

## 2024-02-11 ENCOUNTER — Encounter (HOSPITAL_COMMUNITY)
Admission: RE | Disposition: A | Discharge status: Home / Self Care | Payer: Self-pay | Source: Home / Self Care | Attending: Neurosurgery

## 2024-02-11 ENCOUNTER — Ambulatory Visit (HOSPITAL_COMMUNITY): Payer: Self-pay | Admitting: Vascular Surgery

## 2024-02-11 ENCOUNTER — Ambulatory Visit (HOSPITAL_BASED_OUTPATIENT_CLINIC_OR_DEPARTMENT_OTHER): Payer: Self-pay | Admitting: Anesthesiology

## 2024-02-11 ENCOUNTER — Ambulatory Visit (HOSPITAL_COMMUNITY)

## 2024-02-11 ENCOUNTER — Other Ambulatory Visit: Payer: Self-pay

## 2024-02-11 DIAGNOSIS — M4712 Other spondylosis with myelopathy, cervical region: Principal | ICD-10-CM | POA: Insufficient documentation

## 2024-02-11 DIAGNOSIS — Z86718 Personal history of other venous thrombosis and embolism: Secondary | ICD-10-CM | POA: Diagnosis not present

## 2024-02-11 DIAGNOSIS — Z86711 Personal history of pulmonary embolism: Secondary | ICD-10-CM | POA: Insufficient documentation

## 2024-02-11 DIAGNOSIS — E039 Hypothyroidism, unspecified: Secondary | ICD-10-CM

## 2024-02-11 DIAGNOSIS — Z794 Long term (current) use of insulin: Secondary | ICD-10-CM | POA: Diagnosis not present

## 2024-02-11 DIAGNOSIS — I10 Essential (primary) hypertension: Secondary | ICD-10-CM | POA: Diagnosis not present

## 2024-02-11 DIAGNOSIS — Z8673 Personal history of transient ischemic attack (TIA), and cerebral infarction without residual deficits: Secondary | ICD-10-CM | POA: Diagnosis not present

## 2024-02-11 DIAGNOSIS — E119 Type 2 diabetes mellitus without complications: Secondary | ICD-10-CM | POA: Diagnosis not present

## 2024-02-11 DIAGNOSIS — F32A Depression, unspecified: Secondary | ICD-10-CM | POA: Diagnosis not present

## 2024-02-11 DIAGNOSIS — M4802 Spinal stenosis, cervical region: Secondary | ICD-10-CM | POA: Insufficient documentation

## 2024-02-11 DIAGNOSIS — M4722 Other spondylosis with radiculopathy, cervical region: Principal | ICD-10-CM | POA: Insufficient documentation

## 2024-02-11 DIAGNOSIS — M5412 Radiculopathy, cervical region: Secondary | ICD-10-CM | POA: Diagnosis not present

## 2024-02-11 DIAGNOSIS — Z96652 Presence of left artificial knee joint: Secondary | ICD-10-CM | POA: Diagnosis not present

## 2024-02-11 HISTORY — PX: ANTERIOR CERVICAL DECOMP/DISCECTOMY FUSION: SHX1161

## 2024-02-11 LAB — GLUCOSE, CAPILLARY
Glucose-Capillary: 120 mg/dL — ABNORMAL HIGH (ref 70–99)
Glucose-Capillary: 185 mg/dL — ABNORMAL HIGH (ref 70–99)
Glucose-Capillary: 221 mg/dL — ABNORMAL HIGH (ref 70–99)
Glucose-Capillary: 202 mg/dL — ABNORMAL HIGH (ref 70–99)

## 2024-02-11 LAB — HEMOGLOBIN A1C
Hgb A1c MFr Bld: 6.3 % — ABNORMAL HIGH (ref 4.8–5.6)
Mean Plasma Glucose: 134.11 mg/dL

## 2024-02-11 SURGERY — ANTERIOR CERVICAL DECOMPRESSION/DISCECTOMY FUSION 1 LEVEL
Anesthesia: General

## 2024-02-11 MED ORDER — SODIUM CHLORIDE 0.9% FLUSH: 3.0000 mL | INTRAVENOUS | Status: DC | PRN

## 2024-02-11 MED ORDER — FENTANYL CITRATE (PF) 100 MCG/2ML IJ SOLN
INTRAMUSCULAR | Status: AC
  Filled 2024-02-11: qty 2

## 2024-02-11 MED ORDER — HYDROCODONE-ACETAMINOPHEN 10-325 MG PO TABS: 2.0000 | ORAL_TABLET | ORAL | Status: DC | PRN

## 2024-02-11 MED ORDER — FENTANYL CITRATE (PF) 250 MCG/5ML IJ SOLN
INTRAMUSCULAR | Status: DC | PRN
  Administered 2024-02-11: 150 ug via INTRAVENOUS

## 2024-02-11 MED ORDER — BENZONATATE 100 MG PO CAPS: 200.0000 mg | ORAL_CAPSULE | Freq: Three times a day (TID) | ORAL | Status: DC | PRN

## 2024-02-11 MED ORDER — 0.9 % SODIUM CHLORIDE (POUR BTL) OPTIME
TOPICAL | Status: DC | PRN
  Administered 2024-02-11: 1000 mL

## 2024-02-11 MED ORDER — LIDOCAINE 2% (20 MG/ML) 5 ML SYRINGE
INTRAMUSCULAR | Status: AC
  Filled 2024-02-11: qty 5

## 2024-02-11 MED ORDER — MENTHOL 3 MG MT LOZG
1.0000 | LOZENGE | OROMUCOSAL | Status: DC | PRN
  Filled 2024-02-11: qty 9

## 2024-02-11 MED ORDER — FENTANYL CITRATE (PF) 100 MCG/2ML IJ SOLN
25.0000 ug | INTRAMUSCULAR | Status: DC | PRN
  Administered 2024-02-11 (×3): 50 ug via INTRAVENOUS

## 2024-02-11 MED ORDER — ALBUTEROL SULFATE (2.5 MG/3ML) 0.083% IN NEBU: 3.0000 mL | INHALATION_SOLUTION | Freq: Four times a day (QID) | RESPIRATORY_TRACT | Status: DC | PRN

## 2024-02-11 MED ORDER — AMITRIPTYLINE HCL 50 MG PO TABS
50.0000 mg | ORAL_TABLET | Freq: Every day | ORAL | Status: DC
  Administered 2024-02-11: 50 mg via ORAL
  Filled 2024-02-11: qty 1

## 2024-02-11 MED ORDER — CYCLOSPORINE 0.05 % OP EMUL
1.0000 [drp] | Freq: Every morning | OPHTHALMIC | Status: DC
  Administered 2024-02-12: 1 [drp] via OPHTHALMIC
  Filled 2024-02-11: qty 1

## 2024-02-11 MED ORDER — AMISULPRIDE (ANTIEMETIC) 5 MG/2ML IV SOLN
INTRAVENOUS | Status: DC | PRN
  Administered 2024-02-11: 5 mg via INTRAVENOUS

## 2024-02-11 MED ORDER — HYDROCODONE-ACETAMINOPHEN 5-325 MG PO TABS
1.0000 | ORAL_TABLET | ORAL | Status: DC | PRN
  Administered 2024-02-11: 1 via ORAL
  Filled 2024-02-11: qty 1

## 2024-02-11 MED ORDER — CHLORHEXIDINE GLUCONATE CLOTH 2 % EX PADS: 6.0000 | MEDICATED_PAD | Freq: Once | CUTANEOUS | Status: DC

## 2024-02-11 MED ORDER — SODIUM CHLORIDE 0.9% FLUSH
3.0000 mL | Freq: Two times a day (BID) | INTRAVENOUS | Status: DC
  Administered 2024-02-11: 3 mL via INTRAVENOUS

## 2024-02-11 MED ORDER — NIFEDIPINE ER OSMOTIC RELEASE 30 MG PO TB24
30.0000 mg | ORAL_TABLET | Freq: Every day | ORAL | Status: DC
  Filled 2024-02-11 (×2): qty 1

## 2024-02-11 MED ORDER — ORAL CARE MOUTH RINSE: 15.0000 mL | Freq: Once | OROMUCOSAL | Status: AC

## 2024-02-11 MED ORDER — PROPOFOL 10 MG/ML IV BOLUS
INTRAVENOUS | Status: DC | PRN
  Administered 2024-02-11: 200 mg via INTRAVENOUS
  Administered 2024-02-11: 25 ug/kg/min via INTRAVENOUS

## 2024-02-11 MED ORDER — PANTOPRAZOLE SODIUM 40 MG PO TBEC: 40.0000 mg | DELAYED_RELEASE_TABLET | Freq: Every day | ORAL | Status: DC

## 2024-02-11 MED ORDER — HYDROMORPHONE HCL 1 MG/ML IJ SOLN
INTRAMUSCULAR | Status: AC
  Filled 2024-02-11: qty 1

## 2024-02-11 MED ORDER — HYDROMORPHONE HCL 1 MG/ML IJ SOLN: 1.0000 mg | INTRAMUSCULAR | Status: DC | PRN

## 2024-02-11 MED ORDER — ONDANSETRON HCL 4 MG/2ML IJ SOLN
INTRAMUSCULAR | Status: AC
  Filled 2024-02-11: qty 2

## 2024-02-11 MED ORDER — ONDANSETRON HCL 4 MG/2ML IJ SOLN: 4.0000 mg | Freq: Once | INTRAMUSCULAR | Status: DC | PRN

## 2024-02-11 MED ORDER — CEFAZOLIN SODIUM-DEXTROSE 2-4 GM/100ML-% IV SOLN
2.0000 g | INTRAVENOUS | Status: AC
  Administered 2024-02-11: 2 g via INTRAVENOUS

## 2024-02-11 MED ORDER — LACTATED RINGERS IV SOLN
INTRAVENOUS | Status: DC | PRN
Start: 2024-02-11 — End: 2024-02-11

## 2024-02-11 MED ORDER — ROCURONIUM BROMIDE 10 MG/ML (PF) SYRINGE
PREFILLED_SYRINGE | INTRAVENOUS | Status: AC
  Filled 2024-02-11: qty 10

## 2024-02-11 MED ORDER — ONDANSETRON HCL 4 MG PO TABS: 4.0000 mg | ORAL_TABLET | Freq: Four times a day (QID) | ORAL | Status: DC | PRN

## 2024-02-11 MED ORDER — THROMBIN 5000 UNITS EX KIT
PACK | CUTANEOUS | Status: AC
  Filled 2024-02-11: qty 1

## 2024-02-11 MED ORDER — ONDANSETRON HCL 4 MG/2ML IJ SOLN: 4.0000 mg | Freq: Four times a day (QID) | INTRAMUSCULAR | Status: DC | PRN

## 2024-02-11 MED ORDER — METHOTREXATE SODIUM 2.5 MG PO TABS: 20.0000 mg | ORAL_TABLET | ORAL | Status: DC

## 2024-02-11 MED ORDER — AMISULPRIDE (ANTIEMETIC) 5 MG/2ML IV SOLN
INTRAVENOUS | Status: AC
Start: 2024-02-11 — End: 2024-02-11
  Filled 2024-02-11: qty 2

## 2024-02-11 MED ORDER — DEXAMETHASONE SODIUM PHOSPHATE 10 MG/ML IJ SOLN
INTRAMUSCULAR | Status: DC | PRN
  Administered 2024-02-11: 5 mg via INTRAVENOUS

## 2024-02-11 MED ORDER — PHENYLEPHRINE HCL-NACL 20-0.9 MG/250ML-% IV SOLN
INTRAVENOUS | Status: DC | PRN
Start: 2024-02-11 — End: 2024-02-11
  Administered 2024-02-11 (×2): 80 ug via INTRAVENOUS
  Administered 2024-02-11: 30 ug/min via INTRAVENOUS

## 2024-02-11 MED ORDER — SUGAMMADEX SODIUM 200 MG/2ML IV SOLN
INTRAVENOUS | Status: DC | PRN
  Administered 2024-02-11: 122.4 mg via INTRAVENOUS

## 2024-02-11 MED ORDER — INSULIN GLARGINE 100 UNIT/ML ~~LOC~~ SOLN
28.0000 [IU] | Freq: Every day | SUBCUTANEOUS | Status: DC
  Filled 2024-02-11: qty 0.28

## 2024-02-11 MED ORDER — POLYVINYL ALCOHOL 1.4 % OP SOLN
1.0000 [drp] | Freq: Every day | OPHTHALMIC | Status: DC
  Filled 2024-02-11: qty 15

## 2024-02-11 MED ORDER — VITAMIN D 25 MCG (1000 UNIT) PO TABS
1000.0000 [IU] | ORAL_TABLET | Freq: Every morning | ORAL | Status: DC
Start: 2024-02-12 — End: 2024-02-12
  Administered 2024-02-12: 1000 [IU] via ORAL
  Filled 2024-02-11: qty 1

## 2024-02-11 MED ORDER — CYCLOBENZAPRINE HCL 10 MG PO TABS: 10.0000 mg | ORAL_TABLET | Freq: Three times a day (TID) | ORAL | Status: DC | PRN

## 2024-02-11 MED ORDER — CHLORHEXIDINE GLUCONATE 0.12 % MT SOLN
OROMUCOSAL | Status: AC
  Administered 2024-02-11: 15 mL via OROMUCOSAL
  Filled 2024-02-11: qty 15

## 2024-02-11 MED ORDER — LIDOCAINE 2% (20 MG/ML) 5 ML SYRINGE
INTRAMUSCULAR | Status: DC | PRN
Start: 2024-02-11 — End: 2024-02-11
  Administered 2024-02-11: 60 mg via INTRAVENOUS

## 2024-02-11 MED ORDER — ROCURONIUM BROMIDE 10 MG/ML (PF) SYRINGE
PREFILLED_SYRINGE | INTRAVENOUS | Status: DC | PRN
  Administered 2024-02-11 (×2): 10 mg via INTRAVENOUS
  Administered 2024-02-11: 60 mg via INTRAVENOUS

## 2024-02-11 MED ORDER — CEFAZOLIN SODIUM-DEXTROSE 2-4 GM/100ML-% IV SOLN
INTRAVENOUS | Status: AC
  Filled 2024-02-11: qty 100

## 2024-02-11 MED ORDER — ONDANSETRON HCL 4 MG/2ML IJ SOLN
INTRAMUSCULAR | Status: DC | PRN
Start: 2024-02-11 — End: 2024-02-11
  Administered 2024-02-11: 4 mg via INTRAVENOUS

## 2024-02-11 MED ORDER — FOLIC ACID 1 MG PO TABS: 1.0000 mg | ORAL_TABLET | Freq: Every day | ORAL | Status: DC

## 2024-02-11 MED ORDER — SODIUM CHLORIDE 0.9 % IV SOLN: 250.0000 mL | INTRAVENOUS | Status: DC

## 2024-02-11 MED ORDER — OXYCODONE HCL 5 MG PO TABS: 5.0000 mg | ORAL_TABLET | Freq: Once | ORAL | Status: DC | PRN

## 2024-02-11 MED ORDER — FENTANYL CITRATE (PF) 250 MCG/5ML IJ SOLN
INTRAMUSCULAR | Status: AC
  Filled 2024-02-11: qty 5

## 2024-02-11 MED ORDER — ACETAMINOPHEN 650 MG RE SUPP: 650.0000 mg | RECTAL | Status: DC | PRN

## 2024-02-11 MED ORDER — DEXAMETHASONE SODIUM PHOSPHATE 10 MG/ML IJ SOLN
INTRAMUSCULAR | Status: AC
  Filled 2024-02-11: qty 1

## 2024-02-11 MED ORDER — ACETAMINOPHEN 500 MG PO TABS: 1000.0000 mg | ORAL_TABLET | Freq: Once | ORAL | Status: AC

## 2024-02-11 MED ORDER — THROMBIN 5000 UNITS EX KIT
PACK | CUTANEOUS | Status: AC
  Filled 2024-02-11: qty 2

## 2024-02-11 MED ORDER — CEFAZOLIN SODIUM-DEXTROSE 1-4 GM/50ML-% IV SOLN
1.0000 g | Freq: Three times a day (TID) | INTRAVENOUS | Status: AC
  Administered 2024-02-11 (×2): 1 g via INTRAVENOUS
  Filled 2024-02-11 (×2): qty 50

## 2024-02-11 MED ORDER — INSULIN GLARGINE-YFGN 100 UNIT/ML ~~LOC~~ SOLN
28.0000 [IU] | Freq: Every day | SUBCUTANEOUS | Status: DC
  Filled 2024-02-11: qty 0.28

## 2024-02-11 MED ORDER — HYDROMORPHONE HCL 1 MG/ML IJ SOLN
0.2500 mg | INTRAMUSCULAR | Status: DC | PRN
  Administered 2024-02-11: 0.5 mg via INTRAVENOUS

## 2024-02-11 MED ORDER — CHLORHEXIDINE GLUCONATE CLOTH 2 % EX PADS
6.0000 | MEDICATED_PAD | Freq: Once | CUTANEOUS | Status: DC
Start: 2024-02-11 — End: 2024-02-11

## 2024-02-11 MED ORDER — OXYCODONE HCL 5 MG/5ML PO SOLN: 5.0000 mg | Freq: Once | ORAL | Status: DC | PRN

## 2024-02-11 MED ORDER — INSULIN ASPART 100 UNIT/ML IJ SOLN
0.0000 [IU] | Freq: Three times a day (TID) | INTRAMUSCULAR | Status: DC
  Administered 2024-02-11: 5 [IU] via SUBCUTANEOUS

## 2024-02-11 MED ORDER — THROMBIN 5000 UNITS EX SOLR
OROMUCOSAL | Status: DC | PRN
  Administered 2024-02-11: 5 mL via TOPICAL

## 2024-02-11 MED ORDER — ACETAMINOPHEN 325 MG PO TABS
650.0000 mg | ORAL_TABLET | ORAL | Status: DC | PRN
  Administered 2024-02-11 – 2024-02-12 (×3): 650 mg via ORAL
  Filled 2024-02-11 (×3): qty 2

## 2024-02-11 MED ORDER — CHLORHEXIDINE GLUCONATE 0.12 % MT SOLN: 15.0000 mL | Freq: Once | OROMUCOSAL | Status: AC

## 2024-02-11 MED ORDER — IRBESARTAN 150 MG PO TABS
150.0000 mg | ORAL_TABLET | Freq: Every day | ORAL | Status: DC
  Administered 2024-02-11: 150 mg via ORAL
  Filled 2024-02-11: qty 1

## 2024-02-11 MED ORDER — PHENOL 1.4 % MT LIQD: 1.0000 | OROMUCOSAL | Status: DC | PRN

## 2024-02-11 MED ORDER — PHENYLEPHRINE 80 MCG/ML (10ML) SYRINGE FOR IV PUSH (FOR BLOOD PRESSURE SUPPORT)
PREFILLED_SYRINGE | INTRAVENOUS | Status: DC | PRN
  Administered 2024-02-11: 160 ug via INTRAVENOUS
  Administered 2024-02-11: 80 ug via INTRAVENOUS

## 2024-02-11 MED ORDER — THROMBIN (RECOMBINANT) 5000 UNITS EX SOLR
CUTANEOUS | Status: DC | PRN
  Administered 2024-02-11: 10 mL via TOPICAL

## 2024-02-11 MED ORDER — ACETAMINOPHEN 500 MG PO TABS
ORAL_TABLET | ORAL | Status: AC
  Administered 2024-02-11: 1000 mg via ORAL
  Filled 2024-02-11: qty 2

## 2024-02-11 MED ORDER — LEVOTHYROXINE SODIUM 75 MCG PO TABS
75.0000 ug | ORAL_TABLET | Freq: Every day | ORAL | Status: DC
  Administered 2024-02-12: 75 ug via ORAL
  Filled 2024-02-11: qty 1

## 2024-02-11 MED ORDER — PRAVASTATIN SODIUM 40 MG PO TABS
40.0000 mg | ORAL_TABLET | Freq: Every day | ORAL | Status: DC
  Administered 2024-02-11: 40 mg via ORAL
  Filled 2024-02-11: qty 1

## 2024-02-11 SURGICAL SUPPLY — 43 items
BAG COUNTER SPONGE SURGICOUNT (BAG) ×1 IMPLANT
BAND RUBBER #18 3X1/16 STRL (MISCELLANEOUS) ×2 IMPLANT
BENZOIN TINCTURE PRP APPL 2/3 (GAUZE/BANDAGES/DRESSINGS) ×1 IMPLANT
BIT DRILL 13 (BIT) IMPLANT
BUR MATCHSTICK NEURO 3.0 LAGG (BURR) ×1 IMPLANT
CAGE PEEK 8X14X11 (Cage) IMPLANT
CANISTER SUCT 3000ML PPV (MISCELLANEOUS) ×1 IMPLANT
DRAPE C-ARM 42X72 X-RAY (DRAPES) ×2 IMPLANT
DRAPE LAPAROTOMY 100X72 PEDS (DRAPES) ×1 IMPLANT
DRAPE MICROSCOPE SLANT 54X150 (MISCELLANEOUS) ×1 IMPLANT
DRSG OPSITE POSTOP 4X6 (GAUZE/BANDAGES/DRESSINGS) IMPLANT
DURAPREP 6ML APPLICATOR 50/CS (WOUND CARE) ×1 IMPLANT
ELECT COATED BLADE 2.86 ST (ELECTRODE) ×1 IMPLANT
ELECTRODE REM PT RTRN 9FT ADLT (ELECTROSURGICAL) ×1 IMPLANT
GAUZE 4X4 16PLY ~~LOC~~+RFID DBL (SPONGE) IMPLANT
GAUZE SPONGE 4X4 12PLY STRL (GAUZE/BANDAGES/DRESSINGS) ×1 IMPLANT
GLOVE ECLIPSE 9.0 STRL (GLOVE) ×1 IMPLANT
GLOVE EXAM NITRILE XL STR (GLOVE) IMPLANT
GOWN STRL REUS W/ TWL LRG LVL3 (GOWN DISPOSABLE) IMPLANT
GOWN STRL REUS W/ TWL XL LVL3 (GOWN DISPOSABLE) IMPLANT
GOWN STRL REUS W/TWL 2XL LVL3 (GOWN DISPOSABLE) IMPLANT
HALTER HD/CHIN CERV TRACTION D (MISCELLANEOUS) ×1 IMPLANT
HEMOSTAT POWDER KIT SURGIFOAM (HEMOSTASIS) IMPLANT
KIT BASIN OR (CUSTOM PROCEDURE TRAY) ×1 IMPLANT
KIT TURNOVER KIT B (KITS) ×1 IMPLANT
NDL SPNL 20GX3.5 QUINCKE YW (NEEDLE) ×1 IMPLANT
NEEDLE SPNL 20GX3.5 QUINCKE YW (NEEDLE) ×1 IMPLANT
NS IRRIG 1000ML POUR BTL (IV SOLUTION) ×1 IMPLANT
PACK LAMINECTOMY NEURO (CUSTOM PROCEDURE TRAY) ×1 IMPLANT
PAD ARMBOARD POSITIONER FOAM (MISCELLANEOUS) ×3 IMPLANT
PLATE ELITE VISION 25MM (Plate) IMPLANT
SCREW ST 13X4.5XST VAR NS (Screw) IMPLANT
SCREW ST 13X4XST VA NS SPNE (Screw) IMPLANT
SPONGE INTESTINAL PEANUT (DISPOSABLE) ×1 IMPLANT
SPONGE SURGIFOAM ABS GEL SZ50 (HEMOSTASIS) ×1 IMPLANT
STRIP CLOSURE SKIN 1/2X4 (GAUZE/BANDAGES/DRESSINGS) ×1 IMPLANT
SUT VIC AB 3-0 SH 8-18 (SUTURE) ×1 IMPLANT
SUT VIC AB 4-0 RB1 18 (SUTURE) ×1 IMPLANT
TAPE CLOTH 4X10 WHT NS (GAUZE/BANDAGES/DRESSINGS) ×1 IMPLANT
TOWEL GREEN STERILE (TOWEL DISPOSABLE) ×1 IMPLANT
TOWEL GREEN STERILE FF (TOWEL DISPOSABLE) ×1 IMPLANT
TRAP SPECIMEN MUCUS 40CC (MISCELLANEOUS) ×1 IMPLANT
WATER STERILE IRR 1000ML POUR (IV SOLUTION) ×1 IMPLANT

## 2024-02-11 NOTE — Brief Op Note (Signed)
 02/11/2024  11:26 AM  PATIENT:  Sara Diaz  78 y.o. female  PRE-OPERATIVE DIAGNOSIS:  Radiculopathy, cervical region  POST-OPERATIVE DIAGNOSIS:  Radiculopathy, cervical region  PROCEDURE:  Procedure(s) with comments: ANTERIOR CERVICAL DECOMPRESSION/DISCECTOMY FUSION CERVICAL SIX-CERVICAL SEVEN (N/A) - ACDF - C6-C7  SURGEON:  Surgeons and Role:    Agustina Aldrich, MD - Primary  PHYSICIAN ASSISTANT:   ASSISTANTSAlena An   ANESTHESIA:   general  EBL:  200 mL   BLOOD ADMINISTERED:none  DRAINS: none   LOCAL MEDICATIONS USED:  NONE  SPECIMEN:  No Specimen  DISPOSITION OF SPECIMEN:  N/A  COUNTS:  YES  TOURNIQUET:  * No tourniquets in log *  DICTATION: .Dragon Dictation  PLAN OF CARE: Admit for overnight observation  PATIENT DISPOSITION:  PACU - hemodynamically stable.   Delay start of Pharmacological VTE agent (>24hrs) due to surgical blood loss or risk of bleeding: yes

## 2024-02-11 NOTE — H&P (Signed)
 Sara Diaz is an 78 y.o. female.   Chief Complaint: Neck pain HPI: 78 year old female with remote history of C4-C6 anterior cervical discectomy and fusion presents with neck and left upper extremity pain numbness and some subjective weakness.  Workup demonstrates evidence of progressive spondylosis with severe foraminal stenosis on the left at C6-7.  Patient presents now for C6-7 anterior cervical discectomy and fusion in hopes improving her symptoms.  Past Medical History:  Diagnosis Date   Arthritis    "neck ,hands, back" (05/17/2017)   Barrett esophagus    Complication of anesthesia nausea and vomiting    Depression    DVT (deep vein thrombosis) in pregnancy 1973   BLE; "I had a 10# pregnancy"   Dyspnea    with exertion   GERD (gastroesophageal reflux disease)    off prilosec .developed cough .thought to be allergic  to prilosec   Headache(784.0)    "stopped having them daily after back OR in 2012; restarted in 2017; had 1-2/month" (05/17/2017)   High cholesterol    History of kidney stones 2006,2007   Hypertension    Hypothyroidism    Neuromuscular disorder (HCC)    weakness r leg.   Osteoporosis    Pneumonia 2015-2016 X 2   PONV (postoperative nausea and vomiting)    Pulmonary embolism (HCC) 1973 X 2   RA (rheumatoid arthritis) (HCC)    Type II diabetes mellitus (HCC)    Type II    Past Surgical History:  Procedure Laterality Date   ABDOMINAL ADHESION SURGERY  1992   ABDOMINAL HYSTERECTOMY  1990   ANTERIOR CERVICAL DECOMP/DISCECTOMY FUSION  05/17/2017   ANTERIOR CERVICAL DECOMP/DISCECTOMY FUSION N/A 05/17/2017   Procedure: Anterior Cervical Decompression/Discectomy Fusion - Cervical four-Cervical five - Cervical five-Cervical six;  Surgeon: Agustina Aldrich, MD;  Location: [facility] OR;  Service: Neurosurgery;  Laterality: N/A;   ANTERIOR CERVICAL DECOMP/DISCECTOMY FUSION N/A 06/01/2017   Procedure: Revision on Anterior Cervical Decompression/Discectomy  Cervical four-six;   Surgeon: Agustina Aldrich, MD;  Location: [facility] OR;  Service: Neurosurgery;  Laterality: N/A;   APPENDECTOMY     BACK SURGERY     BREAST SURGERY Right 1968   2 tumors on breast -benign   BREAST SURGERY Right 1980s   1 tumor on breast -benign   CHOLECYSTECTOMY OPEN     CYST EXCISION Left 2001   index finger   CYSTOSCOPY W/ STONE MANIPULATION  2006-2007 X 3   DILATION AND CURETTAGE OF UTERUS  1984   ENTEROCELE REPAIR  2007   ESOPHAGOGASTRODUODENOSCOPY (EGD) WITH ESOPHAGEAL DILATION  4-5 Xs   EYE SURGERY     INCONTINENCE SURGERY  1991; 1993   JOINT REPLACEMENT     JOINT REPLACEMENT Left 2017   thumb; "replaced joint w/tendon"   KNEE ARTHROSCOPY Right    LAMINECTOMY  09/04/2011   Procedure: LUMBAR LAMINECTOMY FOR TUMOR;  Surgeon: Awilda Bogus Lounell Schumacher;  Location: [facility];  Service: Neurosurgery;  Laterality: N/A;  Lumbar five laminectomy for resection of intradural tumor   REFRACTIVE SURGERY Bilateral    TENDON REPAIR Left 2017   ruptured tendon on thumb 2 wk after joint replaced w/tendon   TOTAL KNEE ARTHROPLASTY Left 08/26/2018   TOTAL KNEE ARTHROPLASTY Left 08/26/2018   Procedure: TOTAL KNEE ARTHROPLASTY;  Surgeon: Elly Habermann, MD;  Location: [facility] OR;  Service: Orthopedics;  Laterality: Left;    No family history on file. Social History:  reports that she has never smoked. She has never used smokeless tobacco. She reports that  she does not drink alcohol  and does not use drugs.  Allergies:  Allergies  Allergen Reactions   Etanercept Other (See Comments)    Excessive sweating   Iodinated Contrast Media Other (See Comments), Anaphylaxis and Swelling    Convulsions,seizures  Seizures Seizures    Sulfamethoxazole-Trimethoprim Itching   Lisinopril Other (See Comments)    headaches   Nitrofurantoin Rash    'has red dye in it'   Canagliflozin Other (See Comments)    Constant yeast infection   Fluoxetine Other (See Comments)    Depression   Ioxaglate Other (See Comments)     Convulsions   Nabumetone Other (See Comments)    unknown   Tramadol  Nausea And Vomiting    Patient tolerates without an issue   Adalimumab Rash   Metformin Other (See Comments)    GI upset   Olmesartan Nausea And Vomiting   Penicillins Rash    Has patient had a PCN reaction causing immediate rash, facial/tongue/throat swelling, SOB or lightheadedness with hypotension: Unknown Has patient had a PCN reaction causing severe rash involving mucus membranes or skin necrosis: Unknown Has patient had a PCN reaction that required hospitalization: Unknown Has patient had a PCN reaction occurring within the last 10 years: No If all of the above answers are "NO", then may proceed with Cephalosporin use.    Red Dye #40 (Allura Red) Rash    Rash in the face    Medications Prior to Admission  Medication Sig Dispense Refill   acetaminophen  (TYLENOL  8 HOUR) 650 MG CR tablet Take 1 tablet (650 mg total) by mouth every 8 (eight) hours as needed for pain or fever. 30 tablet 0   albuterol  (VENTOLIN  HFA) 108 (90 Base) MCG/ACT inhaler Inhale 1-2 puffs into the lungs every 6 (six) hours as needed for wheezing or shortness of breath.     amitriptyline  (ELAVIL ) 50 MG tablet Take 50 mg by mouth at bedtime.       Artificial Tear Solution (GENTEAL TEARS) 0.1-0.2-0.3 % SOLN Place 1 drop into both eyes in the morning.     benzonatate (TESSALON) 200 MG capsule Take 200 mg by mouth every 8 (eight) hours as needed for cough.     Cholecalciferol  25 MCG (1000 UT) tablet Take 1,000 Units by mouth in the morning.     cycloSPORINE  (VEVYE ) 0.1 % SOLN Place 1 drop into both eyes in the morning.     folic acid  (FOLVITE ) 800 MCG tablet Take 1,600 mcg by mouth in the morning.     hydroquinone 4 % cream Apply 1 Application topically daily as needed (dark spots).     insulin  glargine, 2 Unit Dial, (TOUJEO MAX SOLOSTAR) 300 UNIT/ML Solostar Pen Inject 28 Units into the skin in the morning.     levothyroxine  (SYNTHROID ) 75 MCG  tablet Take 75 mcg by mouth at bedtime.     methotrexate (RHEUMATREX) 2.5 MG tablet Take 20 mg by mouth every Monday.     NIFEdipine (PROCARDIA-XL/NIFEDICAL-XL) 30 MG 24 hr tablet Take 1 tablet by mouth daily.     omeprazole (PRILOSEC) 40 MG capsule Take 40 mg by mouth 2 (two) times daily.     OZEMPIC, 0.25 OR 0.5 MG/DOSE, 2 MG/3ML SOPN Inject 0.25 mg into the skin every Saturday.     pravastatin  (PRAVACHOL ) 40 MG tablet Take 40 mg by mouth at bedtime.     telmisartan (MICARDIS) 40 MG tablet Take 20 mg by mouth in the morning and at bedtime.  traMADol  (ULTRAM ) 50 MG tablet Take 50 mg by mouth every 12 (twelve) hours as needed (pain.).      Results for orders placed or performed during the hospital encounter of 02/11/24 (from the past 48 hours)  Glucose, capillary     Status: Abnormal   Collection Time: 02/11/24  7:26 AM  Result Value Ref Range   Glucose-Capillary 120 (H) 70 - 99 mg/dL    Comment: Glucose reference range applies only to samples taken after fasting for at least 8 hours.   No results found.  Pertinent items noted in HPI and remainder of comprehensive ROS otherwise negative.  Blood pressure 126/65, pulse 89, temperature 98.4 F (36.9 C), temperature source Oral, resp. rate 18, height 5\' 7"  (1.702 m), weight 61.2 kg, SpO2 98%.  Patient is awake and alert.  She is oriented and appropriate.  Speech is fluent.  Judgment insight are intact.  Cranial nerve function normal bilateral.  Motor examination feels some mild weakness of her left triceps muscle group.  Since examination has decreased sensation pinprick light touch in her left C7 dermatome.  Deep tendon reflexes normal active.  No for long track signs.  Gait and posture are normal.  Examination of her head ears eyes nose and throat is unremarked.  Chest and abdomen are benign.  Extremities are free from injury deformity.   Assessment/Plan C6-7 spondylosis with foraminal stenosis and radiculopathy.  Plan C6-7 anterior  cervical discectomy and fusion with interbody cage, low curves that autograft, and anterior plate instrumentation.  Risks and benefits been explained.  Patient wishes to proceed.  Awilda Bogus Petra Sargeant 02/11/2024, 7:56 AM

## 2024-02-11 NOTE — Anesthesia Procedure Notes (Signed)
 Procedure Name: Intubation Date/Time: 02/11/2024 9:47 AM  Performed by: Alys Julian, CRNAPre-anesthesia Checklist: Patient identified, Emergency Drugs available, Suction available and Patient being monitored Patient Re-evaluated:Patient Re-evaluated prior to induction Oxygen Delivery Method: Circle system utilized Preoxygenation: Pre-oxygenation with 100% oxygen Induction Type: IV induction Ventilation: Mask ventilation without difficulty Laryngoscope Size: Glidescope and 3 Grade View: Grade I Tube type: Oral Tube size: 7.0 mm Number of attempts: 1 Airway Equipment and Method: Video-laryngoscopy, Rigid stylet and Bite block Placement Confirmation: ETT inserted through vocal cords under direct vision, positive ETCO2 and breath sounds checked- equal and bilateral Secured at: 19 cm Tube secured with: Tape Dental Injury: Teeth and Oropharynx as per pre-operative assessment  Comments: Elective glidescope based on previous use

## 2024-02-11 NOTE — Transfer of Care (Signed)
 Immediate Anesthesia Transfer of Care Note  Patient: Sara Diaz  Procedure(s) Performed: ANTERIOR CERVICAL DECOMPRESSION/DISCECTOMY FUSION CERVICAL SIX-CERVICAL SEVEN  Patient Location: PACU  Anesthesia Type:General  Level of Consciousness: awake, alert , and oriented  Airway & Oxygen Therapy: Patient Spontanous Breathing and Patient connected to nasal cannula oxygen  Post-op Assessment: Report given to RN, Post -op Vital signs reviewed and stable, Patient moving all extremities X 4, and Patient able to stick tongue midline  Post vital signs: Reviewed and stable  Last Vitals:  Vitals Value Taken Time  BP 133/66 02/11/24 1132  Temp 36.4 C 02/11/24 1132  Pulse 106 02/11/24 1135  Resp 20 02/11/24 1135  SpO2 92 % 02/11/24 1135  Vitals shown include unfiled device data.  Last Pain:  Vitals:   02/11/24 0757  TempSrc:   PainSc: 0-No pain         Complications: No notable events documented.

## 2024-02-11 NOTE — Op Note (Signed)
 Date of procedure: 02/11/2024  Date of dictation: Same  Service: Neurosurgery  Preoperative diagnosis: C6-7 spondylosis with stenosis and radiculopathy status post prior C4-C6 anterior cervical discectomy and fusion  Postoperative diagnosis: Same  Procedure Name: C6-7 anterior cervical discectomy with interbody fusion utilizing interbody cage, AND autograft, and anterior plate instrumentation  Reexploration of C4-C6 anterior cervical fusion with removal of hardware  Surgeon:Syriana Croslin A.Sherlene Rickel, M.D.  Asst. Surgeon: Sabra Cramp, NP  Anesthesia: General  Indication: 78 year old female with history of C4-C6 anterior cervical discectomy and fusion for which she initially did quite well.  She presents now with neck and bilateral upper extremity symptoms consistent with C7 radiculopathies.  Patient has marked spondylosis with severe foraminal stenosis bilaterally.  She presents now for C6-7 anterior cervical discectomy and fusion.  This will require removal of her prior instrumentation from C4-C6.  Operative note: After induction anesthesia, patient positioned supine with Extended L placeholder traction.  Patient's anterior cervical region prepped and draped sterilely.  Incision made through her old incision.  Dissection performed with the right.  Retractor placed.  Fluoroscopy used.  Levels confirmed.  Previously placed anterior cervical plate was dissected free, disassembled and removed.  Fusions at C4-5 and C5-6 were inspected and found to be quite solid.  Disc base at C6-7 was then incised.  Discectomy was then performed using various instruments down to level the posterior annulus.  Microscope was brought to the field used after major discectomy.  Remaining aspects of annulus and osteophytes removed using high-speed drill down to level the posterior logical limb.  Posterior logical was then elevated and resected in a piecemeal fashion.  Underlying thecal sac was then identified.  A wide central  decompression then performed undercutting the bodies of C6 and C7.  Decompression then proceeded each neural foramina.  Wide anterior foraminotomies performed on the course exiting C7 nerve roots bilaterally.  At this point a very thorough discectomy been achieved.  There was no evidence of any residual stenosis or nerve root compression.  Wound was irrigated.  Gelfoam was placed topically for hemostasis.  8 mm Medtronic anatomic peek cage was then packed with local harvested autograft.  This then impacted into place and recessed slightly from the anterior cortical margin.  Atlantis anterior cervical plate was then placed over the C6 and C7 level.  This then attached under fluoroscopic guidance using 13 mm variable angle screws to each of both levels.  All 4 screws given final tightening found to be solidly in the bone.  Locking screws engaged both levels.  Final images reveal good position cage and hardware proper level with normal alignment of spine.  Wound was then irrigated.  Hemostasis was assured.  Wounds then closed in layers with Vicryl sutures.  Steri-Strips and sterile dressing were applied.  No apparent complications.  Patient tolerated the procedure well and she returns to the recovery room postop.

## 2024-02-12 ENCOUNTER — Encounter (HOSPITAL_COMMUNITY): Payer: Self-pay | Admitting: Neurosurgery

## 2024-02-12 DIAGNOSIS — M4722 Other spondylosis with radiculopathy, cervical region: Secondary | ICD-10-CM | POA: Diagnosis not present

## 2024-02-12 LAB — GLUCOSE, CAPILLARY: Glucose-Capillary: 116 mg/dL — ABNORMAL HIGH (ref 70–99)

## 2024-02-12 MED ORDER — HYDROCODONE-ACETAMINOPHEN 5-325 MG PO TABS: 1.0000 | ORAL_TABLET | ORAL | 0 refills | Status: AC | PRN

## 2024-02-12 MED ORDER — CYCLOBENZAPRINE HCL 10 MG PO TABS: 10.0000 mg | ORAL_TABLET | Freq: Three times a day (TID) | ORAL | 0 refills | Status: AC | PRN

## 2024-02-12 MED FILL — Thrombin (Recombinant) For Soln 5000 Unit: CUTANEOUS | Qty: 2 | Status: AC

## 2024-02-12 NOTE — Care Management Obs Status (Signed)
 MEDICARE OBSERVATION STATUS NOTIFICATION   Patient Details  Name: Sara Diaz MRN: [id number] Date of Birth: Jan 21, 1946   Medicare Observation Status Notification Given:  Yes    Jonathan Neighbor, RN 02/12/2024, 8:47 AM

## 2024-02-12 NOTE — Evaluation (Signed)
 Occupational Therapy Evaluation and Discharge Patient Details Name: Sara Diaz MRN: [id number] DOB: 12/14/1945 Today's Date: 02/12/2024   History of Present Illness   Pt is a 78 yo female s/p C6-7 anterior cervical discectomy with interbody fusion and  reexploration of C4-C6 anterior cervical fusion with removal of hardware. PHMx: OA, HTN, Neuromuscular disorder (weakness in right leg),PE, RA, DM2, cervical sx x2 2018     Clinical Impressions This 78 yo female admitted and underwent above presents to acute OT with all education completed and post op handout provided. No further OT needs, we will sign off.     If plan is discharge home, recommend the following:   Assist for transportation     Functional Status Assessment   Patient has had a recent decline in their functional status and demonstrates the ability to make significant improvements in function in a reasonable and predictable amount of time. (without further need for skilled OT)     Equipment Recommendations   None recommended by OT      Precautions/Restrictions   Precautions Precautions: Cervical Precaution Booklet Issued: Yes (comment) Recall of Precautions/Restrictions: Intact Required Braces or Orthoses: Cervical Brace Cervical Brace: Soft collar (can have off in bed and showering) Restrictions Weight Bearing Restrictions Per Provider Order: No     Mobility Bed Mobility               General bed mobility comments: pt up and about in room, we did discuss in and OOB while protecting neck    Transfers Overall transfer level: Independent                        Balance Overall balance assessment: Independent                                         ADL either performed or assessed with clinical judgement   ADL                                         General ADL Comments: Educated on dressing sequence for upper body, not bending over to get  lower body dressed--bring legs up to her, bed mobility, using 2 cups for brushing teeth to avoid bending over the sink, using cups  with straws for drinking     Vision Patient Visual Report: No change from baseline              Pertinent Vitals/Pain Pain Assessment Pain Assessment: Faces Faces Pain Scale: Hurts a little bit Pain Location: incisional/surgical Pain Descriptors / Indicators: Sore Pain Intervention(s): Limited activity within patient's tolerance, Monitored during session     Extremity/Trunk Assessment Upper Extremity Assessment Upper Extremity Assessment: Overall WFL for tasks assessed           Communication Communication Communication: No apparent difficulties   Cognition Arousal: Alert Behavior During Therapy: WFL for tasks assessed/performed Cognition: No apparent impairments                               Following commands: Intact       Cueing   Cueing Techniques: Verbal cues              Home Living Family/patient expects to  be discharged to:: Private residence Living Arrangements: Spouse/significant other Available Help at Discharge: Family;Available 24 hours/day Type of Home: House Home Access: Stairs to enter [employer] of Steps: 1 Entrance Stairs-Rails: None Home Layout: One level     Bathroom Shower/Tub: Producer, television/film/video: Standard     Home Equipment: Agricultural consultant (2 wheels);Shower seat          Prior Functioning/Environment Prior Level of Function : Independent/Modified Independent;Driving                    OT Problem List: Pain        OT Goals(Current goals can be found in the care plan section)   Acute Rehab OT Goals Patient Stated Goal: to go home today         AM-PAC OT "6 Clicks" Daily Activity     Outcome Measure Help from another person eating meals?: None Help from another person taking care of personal grooming?: None Help from another person  toileting, which includes using toliet, bedpan, or urinal?: None Help from another person bathing (including washing, rinsing, drying)?: None Help from another person to put on and taking off regular upper body clothing?: None Help from another person to put on and taking off regular lower body clothing?: None 6 Click Score: 24   End of Session Nurse Communication:  (no further OT needs)  Activity Tolerance: Patient tolerated treatment well Patient left:  (sitting EOB)  OT Visit Diagnosis: Pain Pain - part of body:  (incisional/surgical)                Time: 1610-9604 OT Time Calculation (min): 13 min Charges:  OT General Charges $OT Visit: 1 Visit OT Evaluation $OT Eval Moderate Complexity: 1 Mod  Sara L. OT Acute Rehabilitation Services Office [phone]    Lenox Raider 02/12/2024, 9:13 AM

## 2024-02-12 NOTE — Discharge Summary (Signed)
 Physician Discharge Summary  Patient ID: Sara Diaz MRN: [id number] DOB/AGE: July 01, 1946 78 y.o.  Admit date: 02/11/2024 Discharge date: 02/12/2024  Admission Diagnoses:  Discharge Diagnoses:  Principal Problem:   Cervical spondylosis with myelopathy and radiculopathy   Discharged Condition: good  Hospital Course: Patient mated to the hospital where she underwent C6-7 anterior cervical decompression and fusion.  Postoperative doing well.  Preoperative neck and upper extremity symptoms much improved.  Standing ambulating and voiding.  Swallowing well.  Voice strong.  Ready for discharge home.  Consults:   Significant Diagnostic Studies:   Treatments:   Discharge Exam: Blood pressure 122/61, pulse 80, temperature 97.9 F (36.6 C), temperature source Oral, resp. rate 16, height 5\' 7"  (1.702 m), weight 61.2 kg, SpO2 97%. Awake and alert.  Oriented and appropriate.  Motor and sensory function intact.  Wound clean and dry.  Chest and abdomen benign.  Disposition: Discharge disposition: 01-Home or Self Care        Allergies as of 02/12/2024       Reactions   Etanercept Other (See Comments)   Excessive sweating   Iodinated Contrast Media Other (See Comments), Anaphylaxis, Swelling   Convulsions,seizures  Seizures Seizures   Sulfamethoxazole-trimethoprim Itching   Lisinopril Other (See Comments)   headaches   Nitrofurantoin Rash   'has red dye in it'   Canagliflozin Other (See Comments)   Constant yeast infection   Fluoxetine Other (See Comments)   Depression   Ioxaglate Other (See Comments)   Convulsions   Nabumetone Other (See Comments)   unknown   Tramadol  Nausea And Vomiting   Patient tolerates without an issue   Adalimumab Rash   Metformin Other (See Comments)   GI upset   Olmesartan Nausea And Vomiting   Penicillins Rash   Has patient had a PCN reaction causing immediate rash, facial/tongue/throat swelling, SOB or lightheadedness with hypotension:  Unknown Has patient had a PCN reaction causing severe rash involving mucus membranes or skin necrosis: Unknown Has patient had a PCN reaction that required hospitalization: Unknown Has patient had a PCN reaction occurring within the last 10 years: No If all of the above answers are "NO", then may proceed with Cephalosporin use 02/11/24 Tolerated cefazolin    Red Dye #40 (allura Red) Rash   Rash in the face        Medication List     TAKE these medications    acetaminophen  650 MG CR tablet Commonly known as: Tylenol  8 Hour Take 1 tablet (650 mg total) by mouth every 8 (eight) hours as needed for pain or fever.   albuterol  108 (90 Base) MCG/ACT inhaler Commonly known as: VENTOLIN  HFA Inhale 1-2 puffs into the lungs every 6 (six) hours as needed for wheezing or shortness of breath.   amitriptyline  50 MG tablet Commonly known as: ELAVIL  Take 50 mg by mouth at bedtime.   benzonatate 200 MG capsule Commonly known as: TESSALON Take 200 mg by mouth every 8 (eight) hours as needed for cough.   Cholecalciferol  25 MCG (1000 UT) tablet Take 1,000 Units by mouth in the morning.   cyclobenzaprine  10 MG tablet Commonly known as: FLEXERIL  Take 1 tablet (10 mg total) by mouth 3 (three) times daily as needed for muscle spasms.   folic acid  800 MCG tablet Commonly known as: FOLVITE  Take 1,600 mcg by mouth in the morning.   GenTeal Tears 0.1-0.2-0.3 % Soln Place 1 drop into both eyes in the morning.   HYDROcodone -acetaminophen  5-325 MG tablet Commonly known as:  NORCO/VICODIN Take 1 tablet by mouth every 4 (four) hours as needed for moderate pain (pain score 4-6) ((score 4 to 6)).   hydroquinone 4 % cream Apply 1 Application topically daily as needed (dark spots).   levothyroxine  75 MCG tablet Commonly known as: SYNTHROID  Take 75 mcg by mouth at bedtime.   methotrexate 2.5 MG tablet Commonly known as: RHEUMATREX Take 20 mg by mouth every Monday.   NIFEdipine 30 MG 24 hr  tablet Commonly known as: PROCARDIA-XL/NIFEDICAL-XL Take 1 tablet by mouth daily.   omeprazole 40 MG capsule Commonly known as: PRILOSEC Take 40 mg by mouth 2 (two) times daily.   Ozempic (0.25 or 0.5 MG/DOSE) 2 MG/3ML Sopn Generic drug: Semaglutide(0.25 or 0.5MG /DOS) Inject 0.25 mg into the skin every Saturday.   pravastatin  40 MG tablet Commonly known as: PRAVACHOL  Take 40 mg by mouth at bedtime.   telmisartan 40 MG tablet Commonly known as: MICARDIS Take 20 mg by mouth in the morning and at bedtime.   Toujeo Max SoloStar 300 UNIT/ML Solostar Pen Generic drug: insulin  glargine (2 Unit Dial) Inject 28 Units into the skin in the morning.   traMADol  50 MG tablet Commonly known as: ULTRAM  Take 50 mg by mouth every 12 (twelve) hours as needed (pain.).   Vevye  0.1 % Soln Generic drug: cycloSPORINE  Place 1 drop into both eyes in the morning.         SignedAwilda Bogus Anie Diaz 02/12/2024, 10:24 AM

## 2024-02-12 NOTE — Discharge Instructions (Signed)

## 2024-02-12 NOTE — Anesthesia Postprocedure Evaluation (Signed)
 Anesthesia Post Note  Patient: Sara Diaz  Procedure(s) Performed: ANTERIOR CERVICAL DECOMPRESSION/DISCECTOMY FUSION CERVICAL SIX-CERVICAL SEVEN     Patient location during evaluation: PACU Anesthesia Type: General Level of consciousness: awake Pain management: pain level controlled Vital Signs Assessment: post-procedure vital signs reviewed and stable Respiratory status: spontaneous breathing, nonlabored ventilation and respiratory function stable Cardiovascular status: blood pressure returned to baseline and stable Postop Assessment: no apparent nausea or vomiting Anesthetic complications: no   No notable events documented.  Last Vitals:  Vitals:   02/11/24 2207 02/12/24 0519  BP: (!) 141/77 127/69  Pulse: (!) 105 80  Resp: 18 18  Temp: 37.1 C 36.7 C  SpO2: 96% 97%    Last Pain:  Vitals:   02/12/24 0630  TempSrc:   PainSc: 2                  Rhya Shan P Niralya Ohanian

## 2024-02-12 NOTE — Plan of Care (Signed)
 Pt doing well. Pt and husband given D/C instructions with verbal understanding. Rx's were sent to the pharmacy by MD. Pt's incision is clean and dry with no sign of infection. Pt's IV was removed prior to D/C. Pt D/C'd home via wheelchair per MD order. Pt is stable @ D/C and has no other needs at this time. Rema Fendt, RN

## 2024-09-01 NOTE — Progress Notes (Unsigned)
 Office Visit Note  Patient: Sara Diaz             Date of Birth: 1945/11/07           MRN: [id number]             PCP: Nikki Hansel Atlas, MD Referring: Nikki Hansel, Atlas, MD Visit Date: 09/02/2024 Occupation: @GUAROCC @  Subjective:  No chief complaint on file.    History of Present Illness: Sara Diaz is a 78 y.o. female ***   Activities of Daily Living:  Patient reports morning stiffness for *** {minute/hour:19697}.   Patient {ACTIONS;DENIES/REPORTS:21021675::Denies} nocturnal pain.  Difficulty dressing/grooming: {ACTIONS;DENIES/REPORTS:21021675::Denies} Difficulty climbing stairs: {ACTIONS;DENIES/REPORTS:21021675::Denies} Difficulty getting out of chair: {ACTIONS;DENIES/REPORTS:21021675::Denies} Difficulty using hands for taps, buttons, cutlery, and/or writing: {ACTIONS;DENIES/REPORTS:21021675::Denies}  No Rheumatology ROS completed.    Rheum History: # Diagnosed in ***.  Manifestation of disease:   Serologies: (+) *** (-) ***  Maintenance Labs: QuantiFERON: *** Hepatitis panel: ***  Current Treatment ***  Prior Treatments ***   PMFS History:  Patient Active Problem List   Diagnosis Date Noted   Cervical spondylosis with myelopathy and radiculopathy 02/11/2024   Primary localized osteoarthritis of left knee 08/26/2018   Type II diabetes mellitus (HCC)    RA (rheumatoid arthritis) (HCC)    Pulmonary embolism (HCC)    PONV (postoperative nausea and vomiting)    Neuromuscular disorder (HCC)    Hypothyroidism    Hypertension    Depression    Complication of anesthesia nausea and vomiting    Barrett esophagus    C5 vertebral fracture (HCC) 06/01/2017   Cervical spinal stenosis 05/17/2017   DVT (deep vein thrombosis) in pregnancy 10/17/1971    Past Medical History:  Diagnosis Date   Arthritis    neck ,hands, back (05/17/2017)   Barrett esophagus    Complication of anesthesia nausea and vomiting    Depression    DVT  (deep vein thrombosis) in pregnancy 1973   BLE; I had a 10# pregnancy   Dyspnea    with exertion   GERD (gastroesophageal reflux disease)    off prilosec .developed cough .thought to be allergic  to prilosec   Headache(784.0)    stopped having them daily after back OR in 2012; restarted in 2017; had 1-2/month (05/17/2017)   High cholesterol    History of kidney stones 2006,2007   Hypertension    Hypothyroidism    Neuromuscular disorder (HCC)    weakness r leg.   Osteoporosis    Pneumonia 2015-2016 X 2   PONV (postoperative nausea and vomiting)    Pulmonary embolism (HCC) 1973 X 2   RA (rheumatoid arthritis) (HCC)    Type II diabetes mellitus (HCC)    Type II    No family history on file. Past Surgical History:  Procedure Laterality Date   ABDOMINAL ADHESION SURGERY  1992   ABDOMINAL HYSTERECTOMY  1990   ANTERIOR CERVICAL DECOMP/DISCECTOMY FUSION  05/17/2017   ANTERIOR CERVICAL DECOMP/DISCECTOMY FUSION N/A 05/17/2017   Procedure: Anterior Cervical Decompression/Discectomy Fusion - Cervical four-Cervical five - Cervical five-Cervical six;  Surgeon: Louis Shove, MD;  Location: [facility] OR;  Service: Neurosurgery;  Laterality: N/A;   ANTERIOR CERVICAL DECOMP/DISCECTOMY FUSION N/A 06/01/2017   Procedure: Revision on Anterior Cervical Decompression/Discectomy  Cervical four-six;  Surgeon: Louis Shove, MD;  Location: [facility] OR;  Service: Neurosurgery;  Laterality: N/A;   ANTERIOR CERVICAL DECOMP/DISCECTOMY FUSION N/A 02/11/2024   Procedure: ANTERIOR CERVICAL DECOMPRESSION/DISCECTOMY FUSION CERVICAL SIX-CERVICAL SEVEN;  Surgeon: Louis Shove, MD;  Location: [facility] OR;  Service: Neurosurgery;  Laterality: N/A;  ACDF - C6-C7   APPENDECTOMY     BACK SURGERY     BREAST SURGERY Right 1968   2 tumors on breast -benign   BREAST SURGERY Right 1980s   1 tumor on breast -benign   CHOLECYSTECTOMY OPEN     CYST EXCISION Left 2001   index finger   CYSTOSCOPY W/ STONE MANIPULATION  2006-2007 X 3   DILATION AND  CURETTAGE OF UTERUS  1984   ENTEROCELE REPAIR  2007   ESOPHAGOGASTRODUODENOSCOPY (EGD) WITH ESOPHAGEAL DILATION  4-5 Xs   EYE SURGERY     INCONTINENCE SURGERY  1991; 1993   JOINT REPLACEMENT     JOINT REPLACEMENT Left 2017   thumb; replaced joint w/tendon   KNEE ARTHROSCOPY Right    LAMINECTOMY  09/04/2011   Procedure: LUMBAR LAMINECTOMY FOR TUMOR;  Surgeon: Victory LABOR Pool;  Location: [facility] OR[facility];  Service: Neurosurgery;  Laterality: N/A;  Lumbar five laminectomy for resection of intradural tumor   REFRACTIVE SURGERY Bilateral    TENDON REPAIR Left 2017   ruptured tendon on thumb 2 wk after joint replaced w/tendon   TOTAL KNEE ARTHROPLASTY Left 08/26/2018   TOTAL KNEE ARTHROPLASTY Left 08/26/2018   Procedure: TOTAL KNEE ARTHROPLASTY;  Surgeon: Jane Charleston, MD;  Location: [facility] OR;  Service: Orthopedics;  Laterality: Left;   Social History   Social History Narrative   Not on file   Immunization History  Administered Date(s) Administered   PFIZER Comirnaty(Gray Top)Covid-19 Tri-Sucrose Vaccine 03/26/2021   PFIZER(Purple Top)SARS-COV-2 Vaccination 11/04/2019, 11/26/2019, 06/03/2020   Pfizer Covid-19 Vaccine Bivalent Booster 65yrs & up 08/10/2021   Pfizer(Comirnaty)Fall Seasonal Vaccine 12 years and older 11/06/2022   Tdap 03/02/2020     Objective: Vital Signs: There were no vitals taken for this visit.   Physical Exam Vitals and nursing note reviewed.  HENT:     Head: Normocephalic and atraumatic.     Nose: Nose normal.  Eyes:     Conjunctiva/sclera: Conjunctivae normal.     Pupils: Pupils are equal, round, and reactive to light.  Cardiovascular:     Rate and Rhythm: Normal rate and regular rhythm.     Heart sounds: Normal heart sounds.  Pulmonary:     Effort: Pulmonary effort is normal.     Breath sounds: Normal breath sounds.  Skin:    General: Skin is warm and dry.  Neurological:     Mental Status: She is alert. Mental status is at baseline.  Psychiatric:         Mood and Affect: Mood normal.        Behavior: Behavior normal.      Musculoskeletal Exam: ***  CDAI Exam: CDAI Score: -- Patient Global: --; Provider Global: -- Swollen: --; Tender: -- Joint Exam 09/02/2024   No joint exam has been documented for this visit   There is currently no information documented on the homunculus. Go to the Rheumatology activity and complete the homunculus joint exam.  Investigation: No additional findings.  Imaging: No results found.  Recent Labs: Lab Results  Component Value Date   WBC 8.7 02/05/2024   HGB 12.4 02/05/2024   PLT 281 02/05/2024   NA 141 02/05/2024   K 4.5 02/05/2024   CL 107 02/05/2024   CO2 25 02/05/2024   GLUCOSE 91 02/05/2024   BUN 20 02/05/2024   CREATININE 1.13 (H) 02/05/2024   BILITOT 1.0 10/14/2023   ALKPHOS 70 10/14/2023   AST 18  10/14/2023   ALT 12 10/14/2023   PROT 5.9 (L) 10/14/2023   ALBUMIN 3.1 (L) 10/14/2023   CALCIUM 9.5 02/05/2024   GFRAA >60 08/28/2018   No results found for: ANA, RF  Speciality Comments: No specialty comments available.  Procedures:  No procedures performed Allergies: Etanercept, Iodinated contrast media, Sulfamethoxazole-trimethoprim, Lisinopril, Nitrofurantoin, Canagliflozin, Fluoxetine, Ioxaglate, Nabumetone, Tramadol , Adalimumab, Metformin, Olmesartan, Penicillins, and Red dye #40 (allura red)   Assessment / Plan:     Visit Diagnoses: No diagnosis found.  #High risk medication use  Orders: No orders of the defined types were placed in this encounter.  No orders of the defined types were placed in this encounter.   I personally spent a total of *** minutes in the care of the patient today including {Time Based Coding:210964241}.  Follow-Up Instructions: No follow-ups on file.   Asberry Claw, DO

## 2024-09-02 ENCOUNTER — Ambulatory Visit
Admission: RE | Admit: 2024-09-02 | Discharge: 2024-09-02 | Disposition: A | Discharge status: Home / Self Care | Source: Ambulatory Visit

## 2024-09-02 ENCOUNTER — Ambulatory Visit

## 2024-09-02 ENCOUNTER — Other Ambulatory Visit: Payer: Self-pay | Admitting: Pharmacist

## 2024-09-02 VITALS — BP 108/67 | HR 92 | Temp 98.4°F | Resp 12 | Ht 66.5 in | Wt 140.8 lb

## 2024-09-02 DIAGNOSIS — M069 Rheumatoid arthritis, unspecified: Secondary | ICD-10-CM

## 2024-09-02 DIAGNOSIS — Z79899 Other long term (current) drug therapy: Secondary | ICD-10-CM

## 2024-09-02 DIAGNOSIS — M1A00X0 Idiopathic chronic gout, unspecified site, without tophus (tophi): Secondary | ICD-10-CM

## 2024-09-02 DIAGNOSIS — M3500 Sicca syndrome, unspecified: Secondary | ICD-10-CM | POA: Diagnosis not present

## 2024-09-02 MED ORDER — METHOTREXATE SODIUM 2.5 MG PO TABS: 20.0000 mg | ORAL_TABLET | ORAL | 0 refills | Status: DC

## 2024-09-02 MED ORDER — PREDNISONE 10 MG PO TABS: ORAL_TABLET | ORAL | 0 refills | Status: AC

## 2024-09-02 MED ORDER — FOLIC ACID 1 MG PO TABS: 2.0000 mg | ORAL_TABLET | Freq: Every day | ORAL | 3 refills | Status: DC

## 2024-09-02 NOTE — Patient Instructions (Signed)
 Vaccines You are taking a medication(s) that can suppress your immune system.  The following immunizations are recommended: Flu annually Covid-19  Td/Tdap (tetanus, diphtheria, pertussis) every 10 years Pneumonia (Prevnar 15 then Pneumovax 23 at least 1 year apart.  Alternatively, can take Prevnar 20 without needing additional dose) Shingrix: 2 doses from 4 weeks to 6 months apart  Please check with your PCP to make sure you are up to date.      If you have signs or symptoms of an infection or start antibiotics: First, call your PCP for workup of your infection. Hold your medication through the infection, until you complete your antibiotics, and until symptoms resolve if you take the following: Injectable medication (Actemra, Benlysta, Cimzia, Cosentyx, Enbrel, Humira, Kevzara, Orencia, Remicade, Simponi, Stelara, Taltz, Tremfya) Methotrexate Leflunomide (Arava) Mycophenolate (Cellcept) Earma, Olumiant, or Rinvoq     Heart Disease Prevention   Your inflammatory disease increases your risk of heart disease which includes heart attack, stroke, atrial fibrillation (irregular heartbeats), high blood pressure, heart failure and atherosclerosis (plaque in the arteries).  It is important to reduce your risk by:   Keep blood pressure, cholesterol, and blood sugar at healthy levels   Smoking Cessation   Maintain a healthy weight  BMI 20-25   Eat a healthy diet  Plenty of fresh fruit, vegetables, and whole grains  Limit saturated fats, foods high in sodium, and added sugars  DASH and Mediterranean diet   Increase physical activity  Recommend moderate physically activity for 150 minutes per week/ 30 minutes a day for five days a week These can be broken up into three separate ten-minute sessions during the day.   Reduce Stress  Meditation, slow breathing exercises, yoga, coloring books  Dental visits twice a year      IF YOU HAVE A SURGERY SCHEDULED: Hold the medication dose  that is due BEFORE your surgery if you take the following: Injectable medication (Actemra, Benlysta, Cimzia, Cosentyx, Enbrel, Humira, Kevzara, Orencia, Remicade, Rituxan, Simponi, Stelara, Taltz, Tremfya) Methotrexate Leflunomide (Arava) Mycophenolate (Cellcept) Hold 3 days before surgery: Earma, Rinvoq, Olumiant Hold 1 week before surgery: azathioprine (Imuran), mycophenolate (CellCept), methotrexate, leflunomide You do NOT have to hold: hydroxychloroquine (Plaquenil), sulfasalazine, or Otezla  AFTER YOUR SURGERY, you will need clearance by your SURGEON to resume your medication and have no signs of infection. They may send us  a note or call our clinic to provide clearance.

## 2024-09-02 NOTE — Progress Notes (Signed)
 Pharmacy Note  Subjective: Patient presents today to [facility] for follow up office visit. Patient seen by the pharmacist for counseling on methotrexate  for rheumatoid arthritis (RA). Patient has been on medications for years, but switched to [facility].  Patient presents today to the [facility] for follow up office visit. Patient seen by pharmacist for counseling on zoledronic  acid (Reclast ) therapy for osteoporosis.    CBC    Component Value Date/Time   WBC 8.7 02/05/2024 1200   RBC 4.11 02/05/2024 1200   HGB 12.4 02/05/2024 1200   HCT 38.7 02/05/2024 1200   PLT 281 02/05/2024 1200   MCV 94.2 02/05/2024 1200   MCH 30.2 02/05/2024 1200   MCHC 32.0 02/05/2024 1200   RDW 15.5 02/05/2024 1200   LYMPHSABS 1.2 08/16/2018 1118   MONOABS 0.4 08/16/2018 1118   EOSABS 0.4 08/16/2018 1118   BASOSABS 0.0 08/16/2018 1118    CMP     Component Value Date/Time   NA 141 02/05/2024 1200   K 4.5 02/05/2024 1200   CL 107 02/05/2024 1200   CO2 25 02/05/2024 1200   GLUCOSE 91 02/05/2024 1200   BUN 20 02/05/2024 1200   CREATININE 1.13 (H) 02/05/2024 1200   CALCIUM 9.5 02/05/2024 1200   PROT 5.9 (L) 10/14/2023 0549   ALBUMIN 3.1 (L) 10/14/2023 0549   AST 18 10/14/2023 0549   ALT 12 10/14/2023 0549   ALKPHOS 70 10/14/2023 0549   BILITOT 1.0 10/14/2023 0549   GFRNONAA 50 (L) 02/05/2024 1200   GFRAA >60 08/28/2018 0223    Baseline Immunosuppressant Therapy Labs TB GOLD 07/2024: negative   Hepatitis Panel 07/2024: negative   HIV No results found for: HIV Immunoglobulins   SPEP    Latest Ref Rng & Units 10/14/2023    5:49 AM  Serum Protein Electrophoresis  Total Protein 6.5 - 8.1 g/dL 5.9    H3EI No results found for: G6PDH TPMT No results found for: TPMT   Chest-xray: 11/18/23: 1. No acute intrathoracic abnormality or explanation for shortness of breath. 2. Minor subsegmental atelectasis in the lingula and both lower lobes. 3.  Coronary artery calcifications.   Contraception: post menopausal  Alcohol  use: none  Assessment/Plan:  METHOTREXATE  Patient was counseled on the purpose, proper use, and adverse effects of methotrexate  including nausea, infection, and signs and symptoms of pneumonitis. Discussed that there is the possibility of an increased risk of malignancy, specifically lymphomas, but it is not well understood if this increased risk is due to the medication or the disease state.  Instructed patient that medication should be held for infection and prior to surgery.  Advised patient to avoid live vaccines. Recommend annual influenza, Pneumovax 23, Prevnar 13, and Shingrix as indicated.   Reviewed instructions with patient to take methotrexate  weekly along with folic acid  daily.  Discussed the importance of frequent monitoring of kidney and liver function and blood counts, and provided patient with standing lab instructions.  Counseled patient to avoid NSAIDs and alcohol  while on methotrexate .  Provided patient with educational materials on methotrexate  and answered all questions.   Patient voiced understanding.  Patient consented to methotrexate  use.  Will upload into chart.    Dose of methotrexate  will be 20 mg weekly along with folic acid  2 mg daily.  ORENCIA  Counseled patient that Orencia  is a selective T-cell costimulation blocker.  Counseled patient on purpose, proper use, and adverse effects of Orencia . The most common adverse effects are increased risk of infections, headache, and injection  site reactions or infusion reactions. There is the possibility of an increased risk of malignancy but it is not well understood if this increased risk is due to the medication or the disease state.  Counseled patient that Orencia  should be held prior to scheduled surgery.  Counseled patient to avoid live vaccines while on Orencia .  Recommend annual influenza, Pneumovax 23, Prevnar 13, and Shingrix as indicated.     Reviewed the importance of regular labs while on Orencia . Standing orders placed.  Provided patient with medication education material and answered all questions.  Patient consented to Orencia .  Will upload consent into patient's chart.  Will apply for Orencia  through patient's insurance.  Reviewed storage information for Orencia .  Patient verbalized understanding.    Patient will continue with current dose of 125 mg once weekly on Saturdays.  RECLAST  History of CKD/impaired renal function: No History of GERD/esophagitis: Yes  Counseled patient that zoledronic  acid is an IV bisphosphonate that reduces bone turnover by inhibiting osteoclasts that chew up bone.  Counseled patient on purpose, proper use, and adverse effects of zoledronic  acid.  Reviewed adverse events of zoledronic  acid including risk of nausea & diarrhea, headache, and muscle & bone pain.  Reviewed rare adverse effect of osteonecrosis of the jaw and advised patient to alert her dentist that she is on zoledronic  acid prior to any major dental work.  Patient confirms she does not have any major dental work scheduled at this time.    Reviewed importance of taking calcium and vitamin D  with bisphosphonate therapy. Recommended daily amount of calcium is 1200mg  and vitamin D  [phone] units.  Advised calcium is better obtained through diet vs supplement.  Counseled about risk of excess calcium supplementation such a kidney stones and increased risk of heart disease.  Assessed dietary intake of calcium.  Provided patient with medication education material and answered all questions. Patient agrees to trial of zoledronic  acid IV infusion 5 mg yearly at this time.  Reclast  is covered 80% by Medicare Part B and the remaining 20% can be covered by a supplemental plan.  Unable to provide exact co-pay amount as it is billed through medical not pharmacy benefit.  Patient verbalized understanding.  Infusion referral will be placed to [facility]  Medical Day [phone]) [facility].   Jenkins Graces, PharmD PGY1 Pharmacy Resident [phone]

## 2024-09-03 LAB — URIC ACID: Uric Acid, Serum: 3.6 mg/dL (ref 2.5–7.0)

## 2024-09-03 LAB — CBC WITH DIFFERENTIAL/PLATELET
Absolute Lymphocytes: 1841 {cells}/uL (ref 850–3900)
Absolute Monocytes: 376 {cells}/uL (ref 200–950)
Basophils Absolute: 20 {cells}/uL (ref 0–200)
Basophils Relative: 0.3 %
Eosinophils Absolute: 172 {cells}/uL (ref 15–500)
Eosinophils Relative: 2.6 %
HCT: 38.3 % (ref 35.0–45.0)
Hemoglobin: 12.2 g/dL (ref 11.7–15.5)
MCH: 29 pg (ref 27.0–33.0)
MCHC: 31.9 g/dL — ABNORMAL LOW (ref 32.0–36.0)
MCV: 91 fL (ref 80.0–100.0)
MPV: 10.4 fL (ref 7.5–12.5)
Monocytes Relative: 5.7 %
Neutro Abs: 4191 {cells}/uL (ref 1500–7800)
Neutrophils Relative %: 63.5 %
Platelets: 175 Thousand/uL (ref 140–400)
RBC: 4.21 Million/uL (ref 3.80–5.10)
RDW: 15.5 % — ABNORMAL HIGH (ref 11.0–15.0)
Total Lymphocyte: 27.9 %
WBC: 6.6 Thousand/uL (ref 3.8–10.8)

## 2024-09-03 LAB — SJOGREN'S SYNDROME ANTIBODS(SSA + SSB)
SSA (Ro) (ENA) Antibody, IgG: <1 AI
SSB (La) (ENA) Antibody, IgG: <1 AI

## 2024-09-04 ENCOUNTER — Telehealth: Payer: Self-pay

## 2024-09-04 NOTE — Telephone Encounter (Signed)
 Patient contacted the office in regards to her Prednisone. Advised the patient Dr. Luba sent in the prednisone taper on 09/02/2024 to [facility] on [address] In [place]. Patient states she will call her pharmacy again.

## 2024-09-08 ENCOUNTER — Telehealth: Payer: Self-pay

## 2024-09-08 NOTE — Telephone Encounter (Signed)
 Patient left a voicemail checking the status of her referral to the hospital for imaging.  Patient requested a return call.

## 2024-09-15 NOTE — Telephone Encounter (Signed)
 Attempted to contact the patient and left message for patient to call the office.

## 2024-09-15 NOTE — Telephone Encounter (Signed)
 Patient returned call to the office. Patient asked to clarify what images she was referring to. Patient states she mixed her doctors up and it was testing another doctor ordered.

## 2024-10-07 ENCOUNTER — Ambulatory Visit: Payer: Self-pay

## 2024-10-07 MED ORDER — ORENCIA 125 MG/ML ~~LOC~~ SOSY: 125.0000 mg | PREFILLED_SYRINGE | SUBCUTANEOUS | 2 refills | Status: DC

## 2024-10-13 ENCOUNTER — Other Ambulatory Visit (HOSPITAL_COMMUNITY): Payer: Self-pay

## 2024-10-19 ENCOUNTER — Other Ambulatory Visit: Payer: Self-pay

## 2024-10-20 ENCOUNTER — Other Ambulatory Visit: Payer: Self-pay | Admitting: Pharmacist

## 2024-10-20 DIAGNOSIS — M810 Age-related osteoporosis without current pathological fracture: Secondary | ICD-10-CM | POA: Insufficient documentation

## 2024-10-20 NOTE — Progress Notes (Signed)
 Referral placed for Zoledronic  acid (Reclast ) IV (G6510) at [facility] [phone]) [place] to start benefits investigation.  Diagnosis: osteoporosis  Provider: Dr. Asberry Claw  Dose: 5mg  IV every 12 months Premedications: acetaminophen  650mg  p.o. and diphenhydramine  25mg  p.o. Labs to be drawn with infusions: N/A  Last dose/infusion date: 12/04/2023 Last Clinic Visit: 09/02/2024 Next Clinic Visit: 12/03/2024  Pertinent baseline labs: creatinine and calcium wnl on 10/01/2024  Once benefits are processed, infusion center will contact patient to schedule.  Sherry Pennant, PharmD, MPH, BCPS, CPP Clinical Pharmacist

## 2024-10-21 ENCOUNTER — Telehealth (HOSPITAL_COMMUNITY): Payer: Self-pay | Admitting: Pharmacy Technician

## 2024-10-21 NOTE — Telephone Encounter (Signed)
 Auth Submission: NO AUTH NEEDED Site of care: [facility] Payer: [employer] Medication & CPT/J Code(s) submitted: Reclast  (Zolendronic acid) J3489 Diagnosis Code: M81.0 Route of submission (phone, fax, portal):  Phone # Fax # Auth type: Buy/Bill HB Units/visits requested: 5MG  X 1 DOSE, EVERY 12 MONTHS Reference number:  Approval from: 10/21/2024 to 10/15/25    Dagoberto Armour, CPhT [facility] Phone: [phone] 10/21/2024

## 2024-10-30 ENCOUNTER — Encounter (HOSPITAL_COMMUNITY): Payer: Self-pay

## 2024-10-30 ENCOUNTER — Other Ambulatory Visit (HOSPITAL_COMMUNITY): Payer: Self-pay

## 2024-10-30 ENCOUNTER — Telehealth: Payer: Self-pay

## 2024-11-03 ENCOUNTER — Telehealth: Payer: Self-pay

## 2024-11-03 ENCOUNTER — Ambulatory Visit

## 2024-11-03 VITALS — BP 111/62 | HR 80 | Resp 15 | Ht 67.0 in | Wt 132.0 lb

## 2024-11-03 DIAGNOSIS — M810 Age-related osteoporosis without current pathological fracture: Secondary | ICD-10-CM | POA: Diagnosis not present

## 2024-11-03 DIAGNOSIS — Z79899 Other long term (current) drug therapy: Secondary | ICD-10-CM | POA: Diagnosis not present

## 2024-11-03 DIAGNOSIS — M069 Rheumatoid arthritis, unspecified: Secondary | ICD-10-CM

## 2024-11-03 MED ORDER — METHOTREXATE SODIUM 2.5 MG PO TABS: 20.0000 mg | ORAL_TABLET | ORAL | 0 refills | Status: AC

## 2024-11-03 MED ORDER — FOLIC ACID 1 MG PO TABS: 2.0000 mg | ORAL_TABLET | Freq: Every day | ORAL | 3 refills | Status: AC

## 2024-11-03 MED ORDER — PREDNISONE 10 MG PO TABS: ORAL_TABLET | ORAL | 0 refills | Status: AC

## 2024-11-03 NOTE — Patient Instructions (Addendum)
 B12 1,000mcg sublingual tablet daily  Certolizumab Pegol Injection What is this medication? CERTOLIZUMAB (SER toe LIZ oo mab) treats autoimmune conditions, such as psoriasis, arthritis, and Crohn's disease. It works by slowing down an overactive immune system. It belongs to a group of medications called TNF inhibitors. It is a monoclonal antibody. This medicine may be used for other purposes; ask your health care provider or pharmacist if you have questions. COMMON BRAND NAME(S): Cimzia What should I tell my care team before I take this medication? They need to know if you have any of these conditions: Cancer Diabetes Guillain-Barre syndrome Heart failure Hepatitis B or history of hepatitis B infection Immune system problems Infection or history of infections Low blood counts, such as low white cell, platelet, or red cell counts Multiple sclerosis Recently received or scheduled to receive a vaccine Tuberculosis, a positive skin test for tuberculosis, or recent close contact with someone who has tuberculosis An unusual or allergic reaction to certolizumab, other medications, latex, rubber, foods, dyes, or preservatives Pregnant or trying to get pregnant Breast-feeding How should I use this medication? This medication is injected under the skin. It is usually given by your care team in a hospital or clinic setting. It may also be given at home. If you get this medication at home, you will be taught how to prepare and give it. Use exactly as directed. Take it as directed on the prescription label at the same time every day. Keep taking it unless your care team tells you to stop. It is important that you put your used needles and syringes in a special sharps container. Do not put them in a trash can. If you do not have a sharps container, call your pharmacist or care team to get one. A special MedGuide will be given to you by the pharmacist with each prescription and refill. Be sure to read  this information carefully each time. Talk to your care team about the use of this medication in children. While it may be prescribed for children as young as 2 years for selected conditions, precautions do apply. Overdosage: If you think you have taken too much of this medicine contact a poison control center or emergency room at once. NOTE: This medicine is only for you. Do not share this medicine with others. What if I miss a dose? If you get this medication at a hospital or clinic: It is important not to miss your dose. Call your care team if you are unable to keep an appointment. If you give yourself this medication at home: If you miss a dose, take it as soon as you can. If it is almost time for your next dose, take only that dose. Do not take double or extra doses. Call your care team with questions. What may interact with this medication? Do not take this medication with any of the following: Biologic medications, such as abatacept , adalimumab, anakinra, etanercept, golimumab, infliximab, natalizumab, rituximab, secukinumab, tocilizumab, ustekinumab Live vaccines Tofacitinib This list may not describe all possible interactions. Give your health care provider a list of all the medicines, herbs, non-prescription drugs, or dietary supplements you use. Also tell them if you smoke, drink alcohol , or use illegal drugs. Some items may interact with your medicine. What should I watch for while using this medication? Visit your care team for regular checks on your progress. Tell your care team if your symptoms do not start to get better or if they get worse. Your condition will be monitored  carefully while you are receiving this medication. You will be tested for tuberculosis (TB) before you start this medication. If your care team prescribes any medication for TB, you should start taking the TB medication before starting this medication. Make sure to finish the full course of TB medication. This  medication may increase your risk of getting an infection. Call your care team for advice if you get a fever, chills, sore throat, or other symptoms of a cold or flu. Do not treat yourself. Try to avoid being around people who are sick. Talk to your care team about your risk of cancer. You may be more at risk for certain types of cancers if you take this medication. What side effects may I notice from receiving this medication? Side effects that you should report to your care team as soon as possible: Allergic reactions--skin rash, itching, hives, swelling of the face, lips, tongue, or throat Aplastic anemia--unusual weakness or fatigue, dizziness, headache, trouble breathing, increased bleeding or bruising Body pain, tingling, or numbness Heart failure--shortness of breath, swelling of the ankles, feet, or hands, sudden weight gain, unusual weakness or fatigue Infection--fever, chills, cough, sore throat, wounds that don't heal, pain or trouble when passing urine, general feeling of discomfort or being unwell Lupus-like syndrome--joint pain, swelling, or stiffness, butterfly-shaped rash on the face, rashes that get worse in the sun, fever, unusual weakness or fatigue Seizures Unusual bruising or bleeding Side effects that usually do not require medical attention (report to your care team if they continue or are bothersome): Back pain Cough Fatigue Fever Headache Pain, redness, or irritation at injection site Sore throat This list may not describe all possible side effects. Call your doctor for medical advice about side effects. You may report side effects to FDA at 1-800-FDA-1088. Where should I keep my medication? Keep out of the reach of children and pets. Store in the refrigerator. Do not freeze. Keep this medication in the original packaging until you are ready to take it. Protect from light. Get rid of any unused medication after the expiration date. To get rid of medications that are  no longer needed or have expired: Take the medication to a medication take-back program. Check with your pharmacy or law enforcement to find a location. If you cannot return the medication, ask your pharmacist or care team how to get rid of this medication safely. NOTE: This sheet is a summary. It may not cover all possible information. If you have questions about this medicine, talk to your doctor, pharmacist, or health care provider.  2025 Elsevier/Gold Standard (2023-07-03 00:00:00)

## 2024-11-03 NOTE — Progress Notes (Signed)
 "  Office Visit Note  Patient: Sara Diaz             Date of Birth: 1946/05/21           MRN: [id number]             PCP: Nikki Hansel Atlas, MD Referring: Nikki Hansel, Atlas, MD Visit Date: 11/03/2024 Occupation: Data Unavailable  Subjective:  No chief complaint on file.   History of Present Illness: Sara Diaz is a 79 y.o. female    Discussed the use of AI scribe software for clinical note transcription with the patient, who gave verbal consent to proceed.  History of Present Illness Sara Diaz is a 79 year old female with rheumatoid arthritis who presents with worsening hand pain and fatigue.  She has experienced a three-year history of declining health despite multiple medication changes and tests that have not identified a specific cause. Amitriptyline  was discontinued five months ago without improvement. Current medications include rosuvastatin, Cymbalta, and Seroquel. She suffers from severe insomnia, having not slept for two of the last three nights, leading to significant daytime fatigue and an inability to function normally.  Her rheumatoid arthritis primarily affects her hands, causing pain every morning, although she can still function. She was previously on Orencia , which was effective in the past but is now unaffordable and less effective. She has also been on methotrexate  continuously and has a history in her chart of reactions to Humira and Enbrel, but she does not remember them. She experiences pain in her hands and occasional pain across her shoulders and neck; she has a history of neck surgery. Her back pain has resolved.  She has a history of diabetes and was taken off insulin  due to dangerously low blood sugar levels at night. She remains on a reduced dose of Ozempic, which has helped stabilize her weight. She experiences dizziness and fatigue. She has a history of diverticulitis and a negative cardiac amyloidosis perfusion study. Her B12 levels  were low, and she is not currently taking supplements.  She experiences occasional dizziness and weakness, particularly when standing, but denies generalized pain outside of her hands. She has a history of severe osteoarthritis, particularly affecting her thumb joints, and has undergone surgery in the past to address this. She reports changes in her handwriting due to hand pain and stiffness, and she experiences morning stiffness and swelling in her fingers.  She also reports excessive mucus production at night, which is bothersome. She reports changes in her nails but has not been formally diagnosed with psoriasis.  She did not take her Orencia  this past Saturday, but otherwise, admits to compliance with her Orencia  and Methotrexate  without missed doses or side effects.    Activities of Daily Living:  Patient reports morning stiffness for 15 hours.   Patient Denies nocturnal pain.  Difficulty dressing/grooming: Denies Difficulty climbing stairs: Reports Difficulty getting out of chair: Denies Difficulty using hands for taps, buttons, cutlery, and/or writing: Reports  Review of Systems  Constitutional:  Positive for fatigue.  HENT:  Negative for mouth sores and mouth dryness.   Eyes:  Negative for dryness.  Respiratory:  Positive for shortness of breath.   Cardiovascular:  Negative for chest pain and palpitations.  Gastrointestinal:  Positive for constipation. Negative for blood in stool and diarrhea.  Endocrine: Negative for increased urination.  Genitourinary:  Negative for involuntary urination.  Musculoskeletal:  Positive for joint swelling, myalgias, morning stiffness, muscle tenderness and myalgias. Negative for  joint pain, gait problem, joint pain and muscle weakness.  Skin:  Positive for sensitivity to sunlight. Negative for color change, rash and hair loss.  Allergic/Immunologic: Negative for susceptible to infections.  Neurological:  Positive for dizziness. Negative for  headaches.  Hematological:  Negative for swollen glands.  Psychiatric/Behavioral:  Positive for sleep disturbance. Negative for depressed mood. The patient is not nervous/anxious.     PMFS History:  Patient Active Problem List   Diagnosis Date Noted   Senile osteoporosis 10/20/2024   Cervical spondylosis with myelopathy and radiculopathy 02/11/2024   Primary localized osteoarthritis of left knee 08/26/2018   Type II diabetes mellitus (HCC)    RA (rheumatoid arthritis) (HCC)    Pulmonary embolism (HCC)    PONV (postoperative nausea and vomiting)    Neuromuscular disorder (HCC)    Hypothyroidism    Hypertension    Depression    Complication of anesthesia nausea and vomiting    Barrett esophagus    C5 vertebral fracture (HCC) 06/01/2017   Cervical spinal stenosis 05/17/2017   DVT (deep vein thrombosis) in pregnancy 10/17/1971    Past Medical History:  Diagnosis Date   Arthritis    neck ,hands, back (05/17/2017)   Barrett esophagus    Complication of anesthesia nausea and vomiting    Depression    Diverticulitis    DVT (deep vein thrombosis) in pregnancy 1973   BLE; I had a 10# pregnancy   Dyspnea    with exertion   GERD (gastroesophageal reflux disease)    off prilosec .developed cough .thought to be allergic  to prilosec   Headache(784.0)    stopped having them daily after back OR in 2012; restarted in 2017; had 1-2/month (05/17/2017)   High cholesterol    History of kidney stones 2006,2007   Hypertension    Hypothyroidism    Neuromuscular disorder (HCC)    weakness r leg.   Osteoporosis    Pneumonia 2015-2016 X 2   PONV (postoperative nausea and vomiting)    Pulmonary embolism (HCC) 1973 X 2   RA (rheumatoid arthritis) (HCC)    Type II diabetes mellitus (HCC)    Type II    Family History  Problem Relation Age of Onset   Heart disease Mother    Hyperparathyroidism Mother    Diabetes Father    Diabetes Brother    Heart attack Maternal Grandmother     Diabetes Paternal Grandmother    Diabetes Paternal Grandfather    Past Surgical History:  Procedure Laterality Date   ABDOMINAL ADHESION SURGERY  1992   ABDOMINAL HYSTERECTOMY  1990   ANTERIOR CERVICAL DECOMP/DISCECTOMY FUSION  05/17/2017   ANTERIOR CERVICAL DECOMP/DISCECTOMY FUSION N/A 05/17/2017   Procedure: Anterior Cervical Decompression/Discectomy Fusion - Cervical four-Cervical five - Cervical five-Cervical six;  Surgeon: Louis Shove, MD;  Location: [facility] OR;  Service: Neurosurgery;  Laterality: N/A;   ANTERIOR CERVICAL DECOMP/DISCECTOMY FUSION N/A 06/01/2017   Procedure: Revision on Anterior Cervical Decompression/Discectomy  Cervical four-six;  Surgeon: Louis Shove, MD;  Location: [facility] OR;  Service: Neurosurgery;  Laterality: N/A;   ANTERIOR CERVICAL DECOMP/DISCECTOMY FUSION N/A 02/11/2024   Procedure: ANTERIOR CERVICAL DECOMPRESSION/DISCECTOMY FUSION CERVICAL SIX-CERVICAL SEVEN;  Surgeon: Louis Shove, MD;  Location[facility] OR;  Service: Neurosurgery;  Laterality: N/A;  ACDF - C6-C7   APPENDECTOMY     BACK SURGERY     BREAST SURGERY Right 1968   2 tumors on breast -benign   BREAST SURGERY Right 1980s   1 tumor on breast -benign  CHOLECYSTECTOMY OPEN     CYST EXCISION Left 2001   index finger   CYSTOSCOPY W/ STONE MANIPULATION  2006-2007 X 3   DILATION AND CURETTAGE OF UTERUS  1984   ENTEROCELE REPAIR  2007   ESOPHAGOGASTRODUODENOSCOPY (EGD) WITH ESOPHAGEAL DILATION  4-5 Xs   EYE SURGERY     INCONTINENCE SURGERY  1991; 1993   JOINT REPLACEMENT     JOINT REPLACEMENT Left 2017   thumb; replaced joint w/tendon   KNEE ARTHROSCOPY Right    LAMINECTOMY  09/04/2011   Procedure: LUMBAR LAMINECTOMY FOR TUMOR;  Surgeon: Victory LABOR Pool;  Location: [facility];  Service: Neurosurgery;  Laterality: N/A;  Lumbar five laminectomy for resection of intradural tumor   REFRACTIVE SURGERY Bilateral    TENDON REPAIR Left 2017   ruptured tendon on thumb 2 wk after joint replaced w/tendon   TOTAL KNEE  ARTHROPLASTY Left 08/26/2018   TOTAL KNEE ARTHROPLASTY Left 08/26/2018   Procedure: TOTAL KNEE ARTHROPLASTY;  Surgeon: Jane Charleston, MD;  Location: [facility] OR;  Service: Orthopedics;  Laterality: Left;   Social History[1] Social History   Social History Narrative   Not on file     Immunization History  Administered Date(s) Administered   PFIZER Comirnaty(Gray Top)Covid-19 Tri-Sucrose Vaccine 03/26/2021   PFIZER(Purple Top)SARS-COV-2 Vaccination 11/04/2019, 11/26/2019, 06/03/2020   Pfizer Covid-19 Vaccine Bivalent Booster 28yrs & up 08/10/2021   Pfizer(Comirnaty)Fall Seasonal Vaccine 12 years and older 11/06/2022   Tdap 03/02/2020     Objective: Vital Signs: BP 111/62 (BP Location: Right Arm, Patient Position: Sitting, Cuff Size: Normal)   Pulse 80   Resp 15   Ht 5' 7 (1.702 m)   Wt 132 lb (59.9 kg)   BMI 20.67 kg/m    Physical Exam Vitals and nursing note reviewed.  HENT:     Head: Normocephalic and atraumatic.     Nose: Nose normal.  Eyes:     Conjunctiva/sclera: Conjunctivae normal.     Pupils: Pupils are equal, round, and reactive to light.  Pulmonary:     Effort: Pulmonary effort is normal. No respiratory distress.  Skin:    General: Skin is warm and dry.     Comments: Nails b/l hands w/ mild brown discoloration  Neurological:     Mental Status: She is alert. Mental status is at baseline.  Psychiatric:        Mood and Affect: Mood normal.        Behavior: Behavior normal.      Musculoskeletal Exam:   CDAI Exam: CDAI Score: -- Patient Global: --; Provider Global: -- Swollen: 0 ; Tender: 14  Joint Exam 11/03/2024      Right  Left  MCP 2   Tender   Tender  MCP 3   Tender   Tender  MCP 4   Tender   Tender  MCP 5   Tender   Tender  PIP 2 (finger)   Tender   Tender  PIP 3 (finger)   Tender   Tender  PIP 4 (finger)   Tender   Tender     Investigation: No additional findings.  Imaging: No results found.  Recent Labs: Lab Results  Component Value  Date   WBC 6.6 09/02/2024   HGB 12.2 09/02/2024   PLT 175 09/02/2024   NA 141 02/05/2024   K 4.5 02/05/2024   CL 107 02/05/2024   CO2 25 02/05/2024   GLUCOSE 91 02/05/2024   BUN 20 02/05/2024   CREATININE 1.13 (H) 02/05/2024  BILITOT 1.0 10/14/2023   ALKPHOS 70 10/14/2023   AST 18 10/14/2023   ALT 12 10/14/2023   PROT 5.9 (L) 10/14/2023   ALBUMIN 3.1 (L) 10/14/2023   CALCIUM 9.5 02/05/2024   GFRAA >60 08/28/2018    Speciality Comments: Reclast : 06/13/17, 06/12/18, 08/19/19 - drug holiday, infusion on 12/04/23  Procedures:  No procedures performed Allergies: Etanercept, Iodinated contrast media, Sulfamethoxazole-trimethoprim, Lisinopril, Nitrofurantoin, Canagliflozin, Fluoxetine, Ioversol, Ioxaglate, Nabumetone, Tramadol , Adalimumab, Metformin, Olmesartan, Penicillins, and Red dye #40 (allura red)   Assessment / Plan:     Visit Diagnoses: Rheumatoid arthritis, involving unspecified site, unspecified whether rheumatoid factor present (HCC) Patient with long-standing hx of RA, not completely controlled on her current regimen of Methotrexate  8 tablets weekly w/ daily folic acid  and Orencia  125mg  q 7 days. Unclear regarding patient's previous reactions to Humira and Enbrel, but ideally would like to use medication that would cover both RA and PsA given changes noted on fingernails today. Patient agreeable to try another TNFi at this time.  After discussion with patient, will initiate Cimzia 400mg  q 30 days.  Side effects reviewed with patient including headache, rash, positive ANA, drug-induced lupus, antibody development, infection, injection site reaction, increased CPK, URI, and sinusitis amongst others.  QuantiFERON and hepatitis checked 07/2024. ACR handout given to patient to review and encouraged to ask questions.    Osteoarthritis; b/l hands Discussed with patient as mentioned above; will re-assess once inflammatory disease better controlled  High risk medication use Starting  Cimzia 400mg  q 30 days w/ continuation of Methotrexate  8 tablets weekly w/ daily folic acid . Toxicity labs stable from 09/2024; will continue to monitor q3 months.   Osteoporosis, unspecified osteoporosis type, unspecified pathological fracture presence Patient currently on Reclast , last infusion 12/04/23; next due 11/2024. Will continue this medication at this time. Last DXA 07/2024, next due 07/2026.   Orders: No orders of the defined types were placed in this encounter.  Meds ordered this encounter  Medications   predniSONE  (DELTASONE ) 10 MG tablet    Sig: Take 2 tablets (20 mg total) by mouth daily with breakfast for 3 days, THEN 1.5 tablets (15 mg total) daily with breakfast for 3 days, THEN 1 tablet (10 mg total) daily with breakfast for 3 days, THEN 0.5 tablets (5 mg total) daily with breakfast for 5 days.    Dispense:  16 tablet    Refill:  0   methotrexate  (RHEUMATREX) 2.5 MG tablet    Sig: Take 8 tablets (20 mg total) by mouth once a week. Caution:Chemotherapy. Protect from light.    Dispense:  96 tablet    Refill:  0   folic acid  (FOLVITE ) 1 MG tablet    Sig: Take 2 tablets (2 mg total) by mouth daily.    Dispense:  180 tablet    Refill:  3    I personally spent a total of 40 minutes in the care of the patient today including preparing to see the patient, getting/reviewing separately obtained history, performing a medically appropriate exam/evaluation, counseling and educating, placing orders, documenting clinical information in the EHR, and independently interpreting results.   Follow-Up Instructions: Return in about 3 months (around 02/01/2025).   Asberry Claw, DO  Note - This record has been created using Animal nutritionist.  Chart creation errors have been sought, but may not always  have been located. Such creation errors do not reflect on  the standard of medical care.      [1]  Social History Tobacco Use   Smoking  status: Never    Passive exposure: Never    Smokeless tobacco: Never  Vaping Use   Vaping status: Never Used  Substance Use Topics   Alcohol  use: No   Drug use: No   "

## 2024-11-03 NOTE — Telephone Encounter (Signed)
 Per Dr. Luba, pt will be transitioning from Orencia  to Cimzia. PA submission is pending completion of chart note.

## 2024-11-05 NOTE — Telephone Encounter (Signed)
 Submitted a Prior Authorization request to [facility] for CIMZIA via CoverMyMeds. Will update once we receive a response.  Key: [id number]   Submitted a Prior Authorization request to [facility] ADVANT[employer]/RX ADVANCE for [facility] via CoverMyMeds. Will update once we receive a response.  Key: [id number]

## 2024-11-06 NOTE — Telephone Encounter (Signed)
 Patient contacted the office and states she had received a call from [facility], patient states she was advised they would cover the Cimzia but the patient would have to pay $1160. Patient states she cannot afford that and that something else will have to be done. Advised the patient I have not seen any updates to either prior authorization. Please advise.

## 2024-11-12 ENCOUNTER — Other Ambulatory Visit (HOSPITAL_COMMUNITY): Payer: Self-pay

## 2024-11-12 NOTE — Telephone Encounter (Signed)
 Received notification from [employer] regarding a prior authorization for CIMZIA . Authorization has been APPROVED from 11/05/2024 to 10/15/2025. Approval letter sent to scan center.  Authorization # [id number]   Received a fax regarding Prior Authorization from [facility] for CIMZIA . Authorization has been DENIED because documentation was not provided stating that pt has tried and failed at least TWO of the formulary alternatives listed below. This is not entirely accurate as documentation WAS submitted stating that pt had had previous adverse reactions to both Humira and Enbrel, however we do NOT have documentation of the specifics. Formulary alternatives include: Orencia  (tried/failed) adalimumab-adaz, adalimumab-fkjp, Enbrel, Hyrimoz, Kevzara, Rinvoq, Xeljanz. Denial letter sent to scan center.   Spoke with pt regarding current status, she informs me that she has been speaking with [facility] and they have informed her that Cimzia  has supposedly been switched from a non-formulary to a formulary medication and that it is going to be covered through her insurance. They have assured her that the only thing they are needing is an Rx for the Cimzia  starter kit to [facility] (she said specifically the one in [place], though I'm almost positive that it will need to go to a Specialty location instead). I'm skeptical that the pt has been told correct information by the [facility] representative she spoke with, but regardless I assured her that I would request that the Rx for Cimzia  would be sent.

## 2024-11-13 MED ORDER — CIMZIA (2 SYRINGE) 200 MG/ML ~~LOC~~ PSKT: PREFILLED_SYRINGE | SUBCUTANEOUS | 0 refills | Status: AC

## 2024-11-14 ENCOUNTER — Other Ambulatory Visit: Payer: Self-pay

## 2024-11-17 ENCOUNTER — Telehealth: Payer: Self-pay

## 2024-11-19 ENCOUNTER — Telehealth: Payer: Self-pay

## 2024-11-19 MED ORDER — ENBREL SURECLICK 50 MG/ML ~~LOC~~ SOAJ: 50.0000 mg | SUBCUTANEOUS | 0 refills | Status: AC

## 2024-11-19 NOTE — Telephone Encounter (Signed)
 Received notification from [employer] regarding a prior authorization for ENBREL . Authorization has been APPROVED from 11/19/2024 to 05/18/2025. Approval letter has not yet been received, but will be sent to scan center once obtained.  Authorization # [id number]   [facility] PA is still pending.

## 2024-11-19 NOTE — Telephone Encounter (Signed)
 Pt verbalized desire to retry Enbrel  as she feels that listing etanercept  on her allergy list is not appropriate, however she states that the MD that added it to her allergy list stated that he couldn't remove it. Dr. Luba is agreeable with proceeding with Enbrel  BIV.  Submitted a Prior Authorization request to [facility] for ENBREL  via CoverMyMeds. Will update once we receive a response.  Key: [id number]    Submitted a Prior Authorization request to [facility] ADVANTAGE/RX ADVANCE for ENBREL  via CoverMyMeds. Will update once we receive a response.  Key: [id number]

## 2024-11-19 NOTE — Telephone Encounter (Signed)
 [facility] request updated stating that additional information is required. Received additional question set that appears to be identical to the first one. Completed and submitted once again.

## 2024-11-19 NOTE — Telephone Encounter (Signed)
 Received notification from [facility] regarding a prior authorization for ENBREL . Authorization has been APPROVED from 11/19/2024 to 11/19/2025. Approval letter sent to scan center.  Patient must fill through [facility]: [phone]  Authorization # [id number]   Reached out to pt and provided update, informed her that I was sending her her prescription on to [facility] and to (hopefully) be expecting a call from them at some point today. Requested that she please reach back out to us  here at the clinic if she runs into any issues or develops any concerns. Pt verbalized understanding to all.

## 2024-12-03 ENCOUNTER — Ambulatory Visit

## 2024-12-08 ENCOUNTER — Encounter (HOSPITAL_COMMUNITY)

## 2025-02-02 ENCOUNTER — Ambulatory Visit
